# Patient Record
Sex: Male | Born: 1950 | Race: White | Hispanic: No | Marital: Single | Smoking: Former smoker
Health system: Southern US, Community
[De-identification: ages and names within clinical notes are randomized; demographics above are authoritative.]

## PROBLEM LIST (undated history)

## (undated) ENCOUNTER — Emergency Department (HOSPITAL_COMMUNITY): Discharge status: Absent without leave | Payer: Medicare HMO

## (undated) DIAGNOSIS — I2510 Atherosclerotic heart disease of native coronary artery without angina pectoris: Secondary | ICD-10-CM

## (undated) DIAGNOSIS — H547 Unspecified visual loss: Secondary | ICD-10-CM

## (undated) DIAGNOSIS — J449 Chronic obstructive pulmonary disease, unspecified: Secondary | ICD-10-CM

## (undated) DIAGNOSIS — M549 Dorsalgia, unspecified: Secondary | ICD-10-CM

## (undated) DIAGNOSIS — E785 Hyperlipidemia, unspecified: Secondary | ICD-10-CM

## (undated) DIAGNOSIS — I10 Essential (primary) hypertension: Secondary | ICD-10-CM

## (undated) DIAGNOSIS — W3400XA Accidental discharge from unspecified firearms or gun, initial encounter: Secondary | ICD-10-CM

## (undated) DIAGNOSIS — I219 Acute myocardial infarction, unspecified: Secondary | ICD-10-CM

## (undated) DIAGNOSIS — K279 Peptic ulcer, site unspecified, unspecified as acute or chronic, without hemorrhage or perforation: Secondary | ICD-10-CM

## (undated) DIAGNOSIS — E7800 Pure hypercholesterolemia, unspecified: Secondary | ICD-10-CM

## (undated) DIAGNOSIS — I739 Peripheral vascular disease, unspecified: Secondary | ICD-10-CM

## (undated) DIAGNOSIS — I809 Phlebitis and thrombophlebitis of unspecified site: Secondary | ICD-10-CM

## (undated) DIAGNOSIS — F419 Anxiety disorder, unspecified: Secondary | ICD-10-CM

## (undated) DIAGNOSIS — M1990 Unspecified osteoarthritis, unspecified site: Secondary | ICD-10-CM

## (undated) DIAGNOSIS — IMO0001 Reserved for inherently not codable concepts without codable children: Secondary | ICD-10-CM

## (undated) DIAGNOSIS — R5383 Other fatigue: Secondary | ICD-10-CM

## (undated) DIAGNOSIS — A084 Viral intestinal infection, unspecified: Secondary | ICD-10-CM

## (undated) DIAGNOSIS — M25559 Pain in unspecified hip: Secondary | ICD-10-CM

## (undated) DIAGNOSIS — R5381 Other malaise: Secondary | ICD-10-CM

## (undated) DIAGNOSIS — K219 Gastro-esophageal reflux disease without esophagitis: Secondary | ICD-10-CM

## (undated) HISTORY — PX: OTHER SURGICAL HISTORY: SHX169

## (undated) HISTORY — PX: CORONARY ARTERY BYPASS GRAFT: SHX141

## (undated) HISTORY — DX: Viral intestinal infection, unspecified: A08.4

## (undated) HISTORY — DX: Dorsalgia, unspecified: M54.9

## (undated) HISTORY — DX: Peptic ulcer, site unspecified, unspecified as acute or chronic, without hemorrhage or perforation: K27.9

## (undated) HISTORY — DX: Reserved for inherently not codable concepts without codable children: IMO0001

## (undated) HISTORY — DX: Other malaise: R53.81

## (undated) HISTORY — DX: Pain in unspecified hip: M25.559

## (undated) HISTORY — DX: Pure hypercholesterolemia, unspecified: E78.00

## (undated) HISTORY — DX: Peripheral vascular disease, unspecified: I73.9

## (undated) HISTORY — PX: TONSILLECTOMY: SUR1361

## (undated) HISTORY — PX: CORONARY ANGIOPLASTY: SHX604

## (undated) HISTORY — DX: Acute myocardial infarction, unspecified: I21.9

## (undated) HISTORY — PX: BACK SURGERY: SHX140

## (undated) HISTORY — DX: Hyperlipidemia, unspecified: E78.5

## (undated) HISTORY — DX: Anxiety disorder, unspecified: F41.9

## (undated) HISTORY — DX: Unspecified visual loss: H54.7

## (undated) HISTORY — DX: Phlebitis and thrombophlebitis of unspecified site: I80.9

## (undated) HISTORY — DX: Other fatigue: R53.83

## (undated) HISTORY — DX: Essential (primary) hypertension: I10

## (undated) HISTORY — DX: Atherosclerotic heart disease of native coronary artery without angina pectoris: I25.10

---

## 2005-08-26 ENCOUNTER — Emergency Department (HOSPITAL_COMMUNITY): Admission: EM | Admit: 2005-08-26 | Discharge: 2005-08-26 | Payer: Self-pay | Admitting: Emergency Medicine

## 2006-05-20 ENCOUNTER — Inpatient Hospital Stay (HOSPITAL_COMMUNITY): Admission: EM | Admit: 2006-05-20 | Discharge: 2006-05-28 | Payer: Self-pay | Admitting: Emergency Medicine

## 2006-05-21 ENCOUNTER — Encounter: Payer: Self-pay | Admitting: Cardiology

## 2006-05-21 ENCOUNTER — Ambulatory Visit: Payer: Self-pay | Admitting: Cardiology

## 2006-05-22 ENCOUNTER — Ambulatory Visit: Payer: Self-pay | Admitting: Cardiothoracic Surgery

## 2006-06-13 ENCOUNTER — Ambulatory Visit: Payer: Self-pay | Admitting: Cardiothoracic Surgery

## 2006-06-19 ENCOUNTER — Ambulatory Visit (HOSPITAL_COMMUNITY): Admission: RE | Admit: 2006-06-19 | Discharge: 2006-06-19 | Payer: Self-pay | Admitting: Cardiovascular Disease

## 2006-06-21 ENCOUNTER — Ambulatory Visit: Payer: Self-pay | Admitting: Cardiovascular Disease

## 2006-07-01 ENCOUNTER — Ambulatory Visit: Payer: Self-pay | Admitting: Cardiothoracic Surgery

## 2006-07-09 ENCOUNTER — Encounter (HOSPITAL_COMMUNITY): Admission: RE | Admit: 2006-07-09 | Discharge: 2006-08-08 | Payer: Self-pay | Admitting: Cardiovascular Disease

## 2006-07-12 ENCOUNTER — Ambulatory Visit: Payer: Self-pay | Admitting: Cardiothoracic Surgery

## 2006-08-28 ENCOUNTER — Ambulatory Visit: Payer: Self-pay | Admitting: Cardiovascular Disease

## 2006-11-14 ENCOUNTER — Ambulatory Visit: Payer: Self-pay | Admitting: Cardiovascular Disease

## 2007-04-19 ENCOUNTER — Ambulatory Visit: Payer: Self-pay | Admitting: Cardiology

## 2007-04-20 ENCOUNTER — Inpatient Hospital Stay (HOSPITAL_COMMUNITY): Admission: EM | Admit: 2007-04-20 | Discharge: 2007-04-21 | Payer: Self-pay | Admitting: Emergency Medicine

## 2007-05-06 ENCOUNTER — Encounter (HOSPITAL_COMMUNITY): Admission: RE | Admit: 2007-05-06 | Discharge: 2007-06-05 | Payer: Self-pay | Admitting: Family Medicine

## 2007-05-12 ENCOUNTER — Ambulatory Visit: Payer: Self-pay | Admitting: Cardiovascular Disease

## 2007-05-13 ENCOUNTER — Ambulatory Visit: Payer: Self-pay | Admitting: Cardiovascular Disease

## 2007-05-23 ENCOUNTER — Ambulatory Visit: Payer: Self-pay

## 2007-08-11 ENCOUNTER — Ambulatory Visit: Payer: Self-pay | Admitting: Cardiovascular Disease

## 2007-09-03 ENCOUNTER — Ambulatory Visit (HOSPITAL_COMMUNITY): Admission: RE | Admit: 2007-09-03 | Discharge: 2007-09-03 | Payer: Self-pay | Admitting: Cardiovascular Disease

## 2007-09-03 ENCOUNTER — Ambulatory Visit: Payer: Self-pay | Admitting: Cardiovascular Disease

## 2007-09-22 ENCOUNTER — Ambulatory Visit: Payer: Self-pay | Admitting: Cardiovascular Disease

## 2007-10-08 ENCOUNTER — Ambulatory Visit: Payer: Self-pay

## 2007-12-22 ENCOUNTER — Ambulatory Visit: Payer: Self-pay | Admitting: Cardiovascular Disease

## 2007-12-22 LAB — CONVERTED CEMR LAB
ALT: 30 units/L (ref 0–53)
AST: 19 units/L (ref 0–37)
Bilirubin, Direct: 0.1 mg/dL (ref 0.0–0.3)
Total Bilirubin: 0.8 mg/dL (ref 0.3–1.2)

## 2008-04-08 ENCOUNTER — Ambulatory Visit: Payer: Self-pay

## 2008-05-14 ENCOUNTER — Ambulatory Visit: Payer: Self-pay

## 2008-05-22 DIAGNOSIS — I739 Peripheral vascular disease, unspecified: Secondary | ICD-10-CM | POA: Insufficient documentation

## 2008-05-22 DIAGNOSIS — I809 Phlebitis and thrombophlebitis of unspecified site: Secondary | ICD-10-CM | POA: Insufficient documentation

## 2008-05-22 DIAGNOSIS — R5381 Other malaise: Secondary | ICD-10-CM

## 2008-05-22 DIAGNOSIS — M25559 Pain in unspecified hip: Secondary | ICD-10-CM

## 2008-05-22 DIAGNOSIS — E785 Hyperlipidemia, unspecified: Secondary | ICD-10-CM

## 2008-05-22 DIAGNOSIS — F411 Generalized anxiety disorder: Secondary | ICD-10-CM | POA: Insufficient documentation

## 2008-05-22 DIAGNOSIS — M549 Dorsalgia, unspecified: Secondary | ICD-10-CM | POA: Insufficient documentation

## 2008-05-22 DIAGNOSIS — E7800 Pure hypercholesterolemia, unspecified: Secondary | ICD-10-CM

## 2008-05-22 DIAGNOSIS — I219 Acute myocardial infarction, unspecified: Secondary | ICD-10-CM | POA: Insufficient documentation

## 2008-05-22 DIAGNOSIS — I2510 Atherosclerotic heart disease of native coronary artery without angina pectoris: Secondary | ICD-10-CM | POA: Insufficient documentation

## 2008-05-22 DIAGNOSIS — K921 Melena: Secondary | ICD-10-CM

## 2008-05-22 DIAGNOSIS — R5383 Other fatigue: Secondary | ICD-10-CM

## 2008-05-24 ENCOUNTER — Ambulatory Visit (HOSPITAL_COMMUNITY): Admission: RE | Admit: 2008-05-24 | Discharge: 2008-05-24 | Payer: Self-pay | Admitting: Family Medicine

## 2008-05-27 ENCOUNTER — Encounter: Payer: Self-pay | Admitting: Cardiovascular Disease

## 2008-06-18 ENCOUNTER — Encounter: Payer: Self-pay | Admitting: Cardiovascular Disease

## 2008-06-23 ENCOUNTER — Ambulatory Visit: Payer: Self-pay | Admitting: Cardiovascular Disease

## 2008-06-23 DIAGNOSIS — F17200 Nicotine dependence, unspecified, uncomplicated: Secondary | ICD-10-CM

## 2008-06-25 ENCOUNTER — Telehealth: Payer: Self-pay | Admitting: Cardiovascular Disease

## 2008-07-02 ENCOUNTER — Telehealth: Payer: Self-pay | Admitting: Cardiovascular Disease

## 2008-08-26 ENCOUNTER — Encounter (INDEPENDENT_AMBULATORY_CARE_PROVIDER_SITE_OTHER): Payer: Self-pay | Admitting: *Deleted

## 2008-10-13 ENCOUNTER — Encounter
Admission: RE | Admit: 2008-10-13 | Discharge: 2008-10-13 | Payer: Self-pay | Admitting: Physical Medicine and Rehabilitation

## 2008-10-15 ENCOUNTER — Ambulatory Visit: Payer: Self-pay

## 2008-10-15 ENCOUNTER — Encounter: Payer: Self-pay | Admitting: Cardiovascular Disease

## 2008-11-03 ENCOUNTER — Encounter (INDEPENDENT_AMBULATORY_CARE_PROVIDER_SITE_OTHER): Payer: Self-pay | Admitting: *Deleted

## 2008-11-15 ENCOUNTER — Telehealth: Payer: Self-pay | Admitting: Cardiovascular Disease

## 2008-12-06 ENCOUNTER — Telehealth (INDEPENDENT_AMBULATORY_CARE_PROVIDER_SITE_OTHER): Payer: Self-pay | Admitting: *Deleted

## 2008-12-08 ENCOUNTER — Inpatient Hospital Stay (HOSPITAL_COMMUNITY): Admission: RE | Admit: 2008-12-08 | Discharge: 2008-12-10 | Payer: Self-pay | Admitting: Orthopedic Surgery

## 2008-12-15 ENCOUNTER — Emergency Department (HOSPITAL_COMMUNITY): Admission: EM | Admit: 2008-12-15 | Discharge: 2008-12-16 | Payer: Self-pay | Admitting: Emergency Medicine

## 2009-02-02 ENCOUNTER — Encounter (HOSPITAL_COMMUNITY): Admission: RE | Admit: 2009-02-02 | Discharge: 2009-03-04 | Payer: Self-pay | Admitting: Orthopedic Surgery

## 2009-02-11 ENCOUNTER — Ambulatory Visit (HOSPITAL_COMMUNITY): Admission: RE | Admit: 2009-02-11 | Discharge: 2009-02-11 | Payer: Self-pay | Admitting: Family Medicine

## 2009-03-08 ENCOUNTER — Ambulatory Visit: Payer: Self-pay | Admitting: Cardiovascular Disease

## 2009-05-17 ENCOUNTER — Encounter: Payer: Self-pay | Admitting: Cardiovascular Disease

## 2009-05-17 DIAGNOSIS — R0989 Other specified symptoms and signs involving the circulatory and respiratory systems: Secondary | ICD-10-CM | POA: Insufficient documentation

## 2009-05-18 ENCOUNTER — Telehealth (INDEPENDENT_AMBULATORY_CARE_PROVIDER_SITE_OTHER): Payer: Self-pay | Admitting: *Deleted

## 2009-06-01 ENCOUNTER — Encounter: Payer: Self-pay | Admitting: Cardiovascular Disease

## 2009-06-01 ENCOUNTER — Ambulatory Visit: Payer: Self-pay

## 2009-10-18 ENCOUNTER — Encounter: Payer: Self-pay | Admitting: Cardiovascular Disease

## 2009-10-21 ENCOUNTER — Ambulatory Visit: Payer: Self-pay

## 2009-10-21 ENCOUNTER — Encounter: Payer: Self-pay | Admitting: Cardiovascular Disease

## 2009-11-21 ENCOUNTER — Telehealth: Payer: Self-pay | Admitting: Cardiovascular Disease

## 2009-12-23 ENCOUNTER — Encounter: Admission: RE | Admit: 2009-12-23 | Discharge: 2009-12-23 | Payer: Self-pay | Admitting: Orthopedic Surgery

## 2010-01-10 ENCOUNTER — Ambulatory Visit: Payer: Self-pay | Admitting: Cardiovascular Disease

## 2010-02-20 ENCOUNTER — Encounter: Payer: Self-pay | Admitting: Physical Medicine and Rehabilitation

## 2010-02-28 NOTE — Progress Notes (Signed)
Summary: pt having abnormal bleeding  Phone Note Call from Patient Call back at Home Phone [phone]   Caller: Spouse/Linda Reason for Call: Talk to Nurse, Talk to Doctor Summary of Call: pt hit his leg and his hand and it was bleeding alot and then it just clot right up and wife is concerned and wants pt to be seen  Initial call taken by: Omer Jack,  November 21, 2009 4:21 PM  Follow-up for Phone Call        pt's wife concerned b/c her husband bumped his hand and it bled more than usual. I advised her that this is normal when you are on a blood thinner. She is also concerned b/c his  hand is bruised which I also told her was normal just to keep it elevated & put some ice on it. She will call us back in the next few days if the bruising gets significantly worse. Whitney Maeola Sarah RN  November 21, 2009 4:49 PM  Follow-up by: Whitney Maeola Sarah RN,  November 21, 2009 4:44 PM

## 2010-02-28 NOTE — Miscellaneous (Signed)
Summary: Orders Update  Clinical Lists Changes  Problems: Added new problem of CAROTID ARTERY DISEASE (ICD-433.10) Orders: Added new Test order of Carotid Duplex (Carotid Duplex) - Signed 

## 2010-02-28 NOTE — Assessment & Plan Note (Signed)
Summary: ROV/ GD   CC:  check up.  History of Present Illness: [name] is seen today for F/U of smoking, PVD with previous right ilac stent.  CAD with prevous CABG and known occluded SVG to RCA.  He is not having any SSCP or claudication.  He is smoking about a ppd.  I counseled him for less than 10 minutes regarding cessation.  He said Chantix and Welbutrin made him worse.  We discussed as previously the use of nicotine replacement including gum and patches but he seems poorly motivated.  He has a significant anxiety disorder and has had issues with Xanax.  He lost his primary care doctor and has floated around now seeing Dr Janna Arch.  He tried to substitue ctalopram for xanax and Tyshan doesnt' like it.  Since I last saw him he had back surgery with Dr Shon Baton and wants to continue getting percocet or narcotic pills.  I told him frankly that both classses of meds are addictive and controlled.  I would like to refer him to behavioral health since he has not seen a psychiatrist before.    Current Problems (verified): 1)  Smoker  (ICD-305.1) 2)  Pre-operative Clearance  () 3)  Myocardial Infarction  (ICD-410.90) 4)  Pvd  (ICD-443.9) 5)  Cad  (ICD-414.00) 6)  Hyperlipidemia  (ICD-272.4) 7)  Peptic Ulcer Disease  (ICD-533.90) 8)  Anxiety  (ICD-300.00) 9)  Phlebitis  (ICD-451.9) 10)  Malaise and Fatigue  (ICD-780.79) 11)  Hypercholesterolemia  (ICD-272.0) 12)  Back Pain  (ICD-724.5) 13)  Hip Pain  (ICD-719.45)  Current Medications (verified): 1)  Folic Acid 1 Mg Tabs (Folic Acid) .... Take 1 Tablet By Mouth Once A Day 2)  Metoprolol Tartrate 25 Mg Tabs (Metoprolol Tartrate) .... Take 1 Tablet By Mouth Two Times A Day 3)  Zocor 40 Mg Tabs (Simvastatin) .... Take 1 Tablet By Mouth Once A Day 4)  Aspirin 81 Mg Tbec (Aspirin) .... Take One Tablet By Mouth Daily 5)  Plavix 75 Mg Tabs (Clopidogrel Bisulfate) .... Take One Tablet By Mouth Daily 6)  Omega-3 Fish Oil 1000 Mg Caps (Omega-3 Fatty  Acids) .... Take 1 Capsule By Mouth Three Times A Day 7)  Nitroglycerin 0.4 Mg Subl (Nitroglycerin) .... One Tablet Under Tongue Every 5 Minutes As Needed For Chest Pain---May Repeat Times Three 8)  Oxycodone-Acetaminophen 7.5-325 Mg Tabs (Oxycodone-Acetaminophen) .... Take 1 Tablet By Mouth Three Times A Day 9)  Trazodone Hcl 50 Mg Tabs (Trazodone Hcl) .... As Needed 10)  Celexa 20 Mg Tabs (Citalopram Hydrobromide) .Marland Kitchen.. 1 Tyab By Mouth Once Daily  Allergies (verified): No Known Drug Allergies  Past History:  Past Medical History: Last updated: 05/22/2008 Current Problems:  MYOCARDIAL INFARCTION (ICD-410.90) PVD (ICD-443.9) CAD (ICD-414.00) HYPERLIPIDEMIA (ICD-272.4) PEPTIC ULCER DISEASE (ICD-533.90) ANXIETY (ICD-300.00) PHLEBITIS (ICD-451.9) MALAISE AND FATIGUE (ICD-780.79) HYPERCHOLESTEROLEMIA (ICD-272.0) BACK PAIN (ICD-724.5) HIP PAIN (ICD-719.45)  Previous hypertension, currently well controlled. Transient vision loss. Recent viral gastroenteritis ruptured plaque in his left main.  Past Surgical History: Last updated: 05/22/2008 CABG: Tyrone Sage 04/2006 Emergency coronary artery bypass grafting x4 with  the left internal mammary sequentially to the second diagonal and distal  left anterior descending artery, reverse saphenous vein graft to the  first obtuse marginal, reverse saphenous vein graft to the posterior  descending with right thigh and calf endovein harvesting. Cath:  04/21/2007 RCA graft occluded non-flow limiting disease in native RCA  Medical Therapy  PVD:  Excell Seltzer 09/03/2007  Abdominal aortography with runoff, right common iliac  angiography, and right external iliac stenting  colectomy brain operations   Family History: Last updated: 05/22/2008 Is pertinent for congestive heart failure in his mother   who died at age 18.  Father died from cancer.  He has a sister who had a   myocardial infarction in her 77s.      Social History: Last updated:  05/22/2008 The patient lives in [place] with his girlfriend.   He is on disability.  He has a longstanding tobacco history.  He has a   history of heavy alcohol use but has cut back over the last several   weeks and is only drinking occasional beer at present.  No recent   history of drug use.   Review of Systems       Denies fever, malais, weight loss, blurry vision, decreased visual acuity, cough, sputum, SOB, hemoptysis, pleuritic pain, palpitaitons, heartburn, abdominal pain, melena, lower extremity edema, claudication, or rash. all other systems reviewed and negative  Vital Signs:  Patient profile:   60 year old male Height:      68 inches Weight:      162 pounds BMI:     24.72 Pulse rate:   62 / minute Resp:     14 per minute BP sitting:   135 / 79  (left arm)  Vitals Entered By: Kem Parkinson (March 08, 2009 10:31 AM)  Physical Exam  General:  Affect appropriate Healthy:  appears stated age HEENT: normal Neck supple with no adenopathy JVP normal no bruits no thyromegaly Lungs clear with no wheezing and good diaphragmatic motion Heart:  S1/S2 no murmur,rub, gallop or click PMI normal Abdomen: benighn, BS positve, no tenderness, no AAA bilateal  bruit.  No HSM or HJR Distal pulses intact with no bruits No edema Neuro non-focal Skin warm and dry    Impression & Recommendations:  Problem # 1:  SMOKER (ICD-305.1) Nicorette gum or patches.  Failed chantix and welbutrin in past.  Vasuclar risk of continued smoking discussed at length  Problem # 2:  PVD (ICD-443.9) No claudication. Prev. iliac stent.  F/U ABI's in 6 months  Problem # 3:  CAD (ICD-414.00) CABG with occluded SVG to RCA.  Normal LV  Continue medical Rx The following medications were removed from the medication list:    Lisinopril 5 Mg Tabs (Lisinopril) .Marland Kitchen... Take 1 tablet by mouth once a day    Metoprolol Tartrate 25 Mg Tabs (Metoprolol tartrate) .Marland Kitchen... 1 tablet by mouth twice a day     Nitroglycerin 0.4 Mg Subl (Nitroglycerin) .Marland Kitchen... Place 1 tablet under tongue as directed    Plavix 75 Mg Tabs (Clopidogrel bisulfate) .Marland Kitchen... Take 1 tablet daily His updated medication list for this problem includes:    Metoprolol Tartrate 25 Mg Tabs (Metoprolol tartrate) .Marland Kitchen... Take 1 tablet by mouth two times a day    Aspirin 81 Mg Tbec (Aspirin) .Marland Kitchen... Take one tablet by mouth daily    Plavix 75 Mg Tabs (Clopidogrel bisulfate) .Marland Kitchen... Take one tablet by mouth daily    Nitroglycerin 0.4 Mg Subl (Nitroglycerin) ..... One tablet under tongue every 5 minutes as needed for chest pain---may repeat times three  Problem # 4:  HYPERLIPIDEMIA (ICD-272.4) Continue statin  Lab work per [employer] The following medications were removed from the medication list:    Simvastatin 40 Mg Tabs (Simvastatin) .Marland Kitchen... Take 1 tablet by mouth at bedtime His updated medication list for this problem includes:    Zocor 40 Mg Tabs (Simvastatin) .Marland Kitchen... Take  1 tablet by mouth once a day  Problem # 5:  ANXIETY (ICD-300.00) Refer to behavioral health.  No xanax or percocet.  Mahy also benefit from pain mainagement referral Orders: Misc. Referral (Misc. Ref)  Patient Instructions: 1)  Your physician recommends that you schedule a follow-up appointment in: 6 MONTHS 2)  You have been referred to BEHAVIORAL HEALTH   EKG Report  Procedure date:  06/23/2008  Findings:      Sinus brady 59 T wave inversion 3,f Abnormal ECG

## 2010-02-28 NOTE — Miscellaneous (Signed)
Summary: Orders Update  Clinical Lists Changes  Orders: Added new Test order of Arterial Duplex Lower Extremity (Arterial Duplex Low) - Signed 

## 2010-02-28 NOTE — Assessment & Plan Note (Signed)
Summary: chest discomfort  Nurse Visit   Vital Signs:  Patient profile:   60 year old male Pulse rate:   47 / minute BP sitting:   138 / 70  (right arm) Cuff size:   large  Vitals Entered By: Sherri Rad, RN, BSN (Jun 01, 2009 2:44 PM)  Current Medications (verified): 1)  Folic Acid 1 Mg Tabs (Folic Acid) .... Take 1 Tablet By Mouth Once A Day 2)  Metoprolol Tartrate 25 Mg Tabs (Metoprolol Tartrate) .... Take 1 Tablet By Mouth Two Times A Day 3)  Zocor 40 Mg Tabs (Simvastatin) .... Take 1 Tablet By Mouth Once A Day 4)  Aspirin 81 Mg Tbec (Aspirin) .... Take One Tablet By Mouth Daily 5)  Plavix 75 Mg Tabs (Clopidogrel Bisulfate) .... Take One Tablet By Mouth Daily 6)  Omega-3 Fish Oil 1000 Mg Caps (Omega-3 Fatty Acids) .... Take 1 Capsule By Mouth Three Times A Day 7)  Nitroglycerin 0.4 Mg Subl (Nitroglycerin) .... One Tablet Under Tongue Every 5 Minutes As Needed For Chest Pain---May Repeat Times Three 8)  Oxycodone-Acetaminophen 7.5-325 Mg Tabs (Oxycodone-Acetaminophen) .... Take 1 Tablet By Mouth Three Times A Day 9)  Trazodone Hcl 50 Mg Tabs (Trazodone Hcl) .... As Needed  Allergies (verified): No Known Drug Allergies  Visit Type:  Nurse Visit Primary Provider:  Dr. Delbert Harness  CC:  chest discomfort.  History of Present Illness: The pt was in the office today for a followup carotid ultrasound. Nursing was asked to see him at the request of our Vascular Tech. The pt states he has complaints of chest discomfort with some discomfort to the neck and jaw area for about a month. He did stop smoking "cold [place]" about a month ago. The pt states he has episodes about every day, but they are improved since he quit smoking. He thinks his discomfort is related to the fact that he may be stressed about having stopped smoking. The pt did mention he is not pleased by the fact that Dr. Cecelia Byars stopped his Xanax. He states he may be trying to find a new doctor due to this fact. I asked him if  he ever followed up with Behavioral Health. He states he did not. I have reviewed the above with Dr. Jens Som. He has had  no EKG changes. The pt did not want to f/u with Dr. Jens Som today or Dr. [name] tomorrow. He feels his symptoms are r/t the fact he has quit smoking. He has not required any NTG. I have instructed the pt should his symptoms become more persistent, that he should let us know or report to the ER. The pt verbalizes understanding.

## 2010-02-28 NOTE — Progress Notes (Signed)
----   Converted from flag ---- ---- 04/29/2009 2:45 PM, Merita Norton Lloyd-Fate wrote: Pt has not returned any of jennifer calls from Dr. Dawayne Cirri office. Merita Norton Lloyd-Fate  April 29, 2009 2:45 PM'  ---- 03/08/2009 10:56 AM, Connye Burkitt wrote:   ---- 03/08/2009 10:51 AM, Deliah Goody, RN wrote: The following orders have been entered for this patient and placed on Admin Hold:  Type:     Referral       Code:   Misc. Ref Description:   Misc. Referral Order Date:   03/08/2009   Authorized By:   Colon Branch, MD, [facility] Order #:   [id number] Clinical Notes:   Type of Referral:BEHAVIORAL HEALTH Reason:ANXIETY ------------------------------

## 2010-03-02 NOTE — Assessment & Plan Note (Signed)
Summary: rov   Visit Type:  Follow-up Primary Provider:  Dr. Delbert Harness  CC:  10 month ROV; C/O shortness of Breath w/o exertion; Change Zocor (?).  History of Present Illness: [name] is seen today for F/U of smoking, PVD with previous right ilac stent.  CAD with prevous CABG and known occluded SVG to RCA.  He is not having any SSCP or claudication.  He is smoking about a ppd.  I counseled him for less than 10 minutes regarding cessation.  He said Chantix and Welbutrin made him worse.  We discussed as previously the use of nicotine replacement including gum and patches but he seems poorly motivated.  He has a significant anxiety disorder and has had issues with Xanax.  He lost his primary care doctor and has floated around last  seeing Dr Janna Arch.  He tried to substitue ctalopram for xanax and Roey doesnt' like it.  He has some anger issues and actually admits to buying Xanax on the street.  We discussed a referral to behavioral health and I think this would be helpful  Since I last saw him he had back surgery with Dr Shon Baton with limited success  He still has a lot of back pain and is due to get an injection next week.  Told him to stop ASA 5 days before  Reviewed ABI;s  10/21/09 ok Reviewed carotids 5/111  40-59% bilateral disease  Current Problems (verified): 1)  Carotid Artery Disease  (ICD-433.10) 2)  Smoker  (ICD-305.1) 3)  Pre-operative Clearance  () 4)  Myocardial Infarction  (ICD-410.90) 5)  Pvd  (ICD-443.9) 6)  Cad  (ICD-414.00) 7)  Hyperlipidemia  (ICD-272.4) 8)  Peptic Ulcer Disease  (ICD-533.90) 9)  Anxiety  (ICD-300.00) 10)  Phlebitis  (ICD-451.9) 11)  Malaise and Fatigue  (ICD-780.79) 12)  Hypercholesterolemia  (ICD-272.0) 13)  Back Pain  (ICD-724.5) 14)  Hip Pain  (ICD-719.45)  Current Medications (verified): 1)  Folic Acid 1 Mg Tabs (Folic Acid) .... Take 1 Tablet By Mouth Once A Day 2)  Metoprolol Tartrate 25 Mg Tabs (Metoprolol Tartrate) .... Take 1 Tablet By  Mouth Two Times A Day 3)  Zocor 40 Mg Tabs (Simvastatin) .... Take 1 Tablet By Mouth Once A Day 4)  Aspirin 81 Mg Tbec (Aspirin) .... Take One Tablet By Mouth Daily 5)  Plavix 75 Mg Tabs (Clopidogrel Bisulfate) .... Take One Tablet By Mouth Daily 6)  Omega-3 Fish Oil 1000 Mg Caps (Omega-3 Fatty Acids) .... Take 1 Capsule By Mouth Three Times A Day 7)  Nitroglycerin 0.4 Mg Subl (Nitroglycerin) .... One Tablet Under Tongue Every 5 Minutes As Needed For Chest Pain---May Repeat Times Three 8)  Hydrocodone-Acetaminophen 7.5-500 Mg Tabs (Hydrocodone-Acetaminophen) .... As Directed 9)  Trazodone Hcl 50 Mg Tabs (Trazodone Hcl) .... As Needed  Allergies (verified): No Known Drug Allergies  Past History:  Past Medical History: Last updated: 05/22/2008 Current Problems:  MYOCARDIAL INFARCTION (ICD-410.90) PVD (ICD-443.9) CAD (ICD-414.00) HYPERLIPIDEMIA (ICD-272.4) PEPTIC ULCER DISEASE (ICD-533.90) ANXIETY (ICD-300.00) PHLEBITIS (ICD-451.9) MALAISE AND FATIGUE (ICD-780.79) HYPERCHOLESTEROLEMIA (ICD-272.0) BACK PAIN (ICD-724.5) HIP PAIN (ICD-719.45)  Previous hypertension, currently well controlled. Transient vision loss. Recent viral gastroenteritis ruptured plaque in his left main.  Past Surgical History: Last updated: 05/22/2008 CABG: Tyrone Sage 04/2006 Emergency coronary artery bypass grafting x4 with  the left internal mammary sequentially to the second diagonal and distal  left anterior descending artery, reverse saphenous vein graft to the  first obtuse marginal, reverse saphenous vein graft to the posterior  descending with  right thigh and calf endovein harvesting. Cath:  04/21/2007 RCA graft occluded non-flow limiting disease in native RCA  Medical Therapy  PVD:  Excell Seltzer 09/03/2007  Abdominal aortography with runoff, right common iliac   angiography, and right external iliac stenting  colectomy brain operations   Family History: Last updated: 05/22/2008 Is pertinent for  congestive heart failure in his mother   who died at age 43.  Father died from cancer.  He has a sister who had a   myocardial infarction in her 68s.      Social History: Last updated: 05/22/2008 The patient lives in [place] with his girlfriend.   He is on disability.  He has a longstanding tobacco history.  He has a   history of heavy alcohol use but has cut back over the last several   weeks and is only drinking occasional beer at present.  No recent   history of drug use.   Review of Systems       Denies fever, malais, weight loss, blurry vision, decreased visual acuity, cough, sputum, SOB, hemoptysis, pleuritic pain, palpitaitons, heartburn, abdominal pain, melena, lower extremity edema, claudication, or rash.   Vital Signs:  Patient profile:   60 year old male Height:      68 inches Weight:      172 pounds BMI:     26.25 Pulse rate:   60 / minute Pulse rhythm:   regular BP sitting:   136 / 80  (left arm) Cuff size:   regular  Vitals Entered By: Stanton Kidney, EMT-P (January 10, 2010 10:41 AM)  Physical Exam  General:  Affect appropriate Healthy:  appears stated age HEENT: normal Neck supple with no adenopathy JVP normal left bruits no thyromegaly Lungs clear with no wheezing and good diaphragmatic motion Heart:  S1/S2 no murmur,rub, gallop or click PMI normal Abdomen: benighn, BS positve, no tenderness, no AAA no bruit.  No HSM or HJR Distal pulses intact with bilateral femoral bruits No edema Neuro non-focal Skin warm and dry    Impression & Recommendations:  Problem # 1:  CAROTID ARTERY DISEASE (ICD-433.10) Continue ASA and Plavix  F/U duplex 5/12  40-59% bilateral disease His updated medication list for this problem includes:    Aspirin 81 Mg Tbec (Aspirin) .Marland Kitchen... Take one tablet by mouth daily    Plavix 75 Mg Tabs (Clopidogrel bisulfate) .Marland Kitchen... Take one tablet by mouth daily  Orders: Carotid Duplex (Carotid Duplex)  Problem # 2:  SMOKER  (ICD-305.1) Failed Chantix and Welbutrin  Not modivated to use patches.  Discussed risk of lung cancer and progressive vascular disease  Problem # 3:  PVD (ICD-443.9) Stable ABI's with activity limited by back pain not claudication  Problem # 4:  CAD (ICD-414.00) Stable no angina His updated medication list for this problem includes:    Metoprolol Tartrate 25 Mg Tabs (Metoprolol tartrate) .Marland Kitchen... Take 1 tablet by mouth two times a day    Aspirin 81 Mg Tbec (Aspirin) .Marland Kitchen... Take one tablet by mouth daily    Plavix 75 Mg Tabs (Clopidogrel bisulfate) .Marland Kitchen... Take one tablet by mouth daily    Nitroglycerin 0.4 Mg Subl (Nitroglycerin) ..... One tablet under tongue every 5 minutes as needed for chest pain---may repeat times three  Problem # 5:  HYPERLIPIDEMIA (ICD-272.4) Continue statin Check LFTS next visit as he is not seeing a primary His updated medication list for this problem includes:    Zocor 40 Mg Tabs (Simvastatin) .Marland Kitchen... 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  Will refer to behavioral health Orders: Misc. Referral (Misc. Ref)  Problem # 7:  BACK PAIN (ICD-724.5) F/U Dr Shon Baton.  Unfortunately he has resultant chronic pain that has led to dependance.  Injection latter next week  Hold ASA and Plavix 5 days before  Patient Instructions: 1)  Your physician recommends that you schedule a follow-up appointment in: May 2012 with a carotid doppler the same day 2)  Your physician recommends that you continue on your current medications as directed. Please refer to the Current Medication list given to you today. 3)  Your physician has requested that you have a carotid duplex in May. This test is an ultrasound of the carotid arteries in your neck. It looks at blood flow through these arteries that supply the brain with blood. Allow one hour for this exam. There are no restrictions or special instructions. 4)   You have been referred to [facility]. Prescriptions: NITROGLYCERIN 0.4 MG SUBL (NITROGLYCERIN) One tablet under tongue every 5 minutes as needed for chest pain---may repeat times three  #21 x 6   Entered by:   Stanton Kidney, EMT-P   Authorized by:   Colon Branch, MD, [facility]   Signed by:   Stanton Kidney, EMT-P on 01/10/2010   Method used:   Electronically to        [facility]  [address] (retail)       [address]       [place], [place]  [address]       Ph: [phone]       Fax: [phone]   RxID:   [id number]

## 2010-05-03 ENCOUNTER — Telehealth: Payer: Self-pay | Admitting: Cardiovascular Disease

## 2010-05-03 LAB — CBC
HCT: 29.9 % — ABNORMAL LOW (ref 39.0–52.0)
Hemoglobin: 10.3 g/dL — ABNORMAL LOW (ref 13.0–17.0)
Hemoglobin: 14.1 g/dL (ref 13.0–17.0)
MCHC: 34.4 g/dL (ref 30.0–36.0)
MCV: 92.5 fL (ref 78.0–100.0)
MCV: 92.9 fL (ref 78.0–100.0)
MCV: 93.4 fL (ref 78.0–100.0)
RBC: 3.22 MIL/uL — ABNORMAL LOW (ref 4.22–5.81)
RBC: 3.49 MIL/uL — ABNORMAL LOW (ref 4.22–5.81)
RDW: 13.2 % (ref 11.5–15.5)
WBC: 16.1 10*3/uL — ABNORMAL HIGH (ref 4.0–10.5)
WBC: 16.3 10*3/uL — ABNORMAL HIGH (ref 4.0–10.5)

## 2010-05-03 LAB — POCT I-STAT 4, (NA,K, GLUC, HGB,HCT)
Glucose, Bld: 139 mg/dL — ABNORMAL HIGH (ref 70–99)
HCT: 35 % — ABNORMAL LOW (ref 39.0–52.0)
Hemoglobin: 11.9 g/dL — ABNORMAL LOW (ref 13.0–17.0)

## 2010-05-03 LAB — BASIC METABOLIC PANEL
CO2: 29 mEq/L (ref 19–32)
Calcium: 10.1 mg/dL (ref 8.4–10.5)
Glucose, Bld: 91 mg/dL (ref 70–99)
Sodium: 140 mEq/L (ref 135–145)

## 2010-05-03 LAB — TYPE AND SCREEN: Antibody Screen: NEGATIVE

## 2010-05-03 NOTE — Telephone Encounter (Addendum)
Spoke with pts wife, pt is being evaluated by dr ramus and dr Shon Baton for spinal problems. They are asking the pt to stop his plavix 5 days prior to procedure and 5 days after the procedure. Uncertain of procedure type, wife was unsure. Will forward for dr [name] review.

## 2010-05-03 NOTE — Telephone Encounter (Signed)
Pt needs to go off plavix for 10 days due to a procedure on his back

## 2010-05-03 NOTE — Telephone Encounter (Signed)
Unable to reach pt or leave a message  

## 2010-05-04 NOTE — Telephone Encounter (Signed)
Spoke with pt wife, she is aware he is okay to hold his plavix. [name]

## 2010-05-04 NOTE — Telephone Encounter (Signed)
Ok to stop plavix for spine implant

## 2010-06-01 ENCOUNTER — Other Ambulatory Visit: Payer: Self-pay | Admitting: *Deleted

## 2010-06-01 MED ORDER — SIMVASTATIN 40 MG PO TABS: 40.0000 mg | ORAL_TABLET | Freq: Every evening | ORAL | Status: DC

## 2010-06-13 NOTE — Discharge Summary (Signed)
NAMEDONTREZ, PETTIS NO.:  [id number]   MEDICAL RECORD NO.:  [id number]          PATIENT TYPE:  INP   LOCATION:  2041                         FACILITY:  [facility]   PHYSICIAN:  Sheliah Plane, MD    DATE OF BIRTH:  1950-02-22   DATE OF ADMISSION:  05/20/2006  DATE OF DISCHARGE:  05/28/2006                               DISCHARGE SUMMARY   ADDENDUM TO DISCHARGE SUMMARY:  [name] was originally scheduled to be discharged home on May 27, 2006.  However, upon morning round evaluation on the 28th, he was found  to have a decrease in his hemoglobin and hematocrit from a prior level  of 7.3 and 21.6, respectively to 6.6 and 19.4, respectively.  The  patient was completely asymptomatic and stool guaiacs were negative.  There were no overt signs of bleeding and he felt well otherwise.  He  had already been started on oral iron replacement and folic acid.  He  was seen and evaluated by Dr. Tyrone Sage and the patient requested to  avoid a transfusion.  It was elected to continue to monitor this  conservatively and repeat a CBC the following morning.  [name]  remained stable and his hemoglobin and hematocrit also remained stable.  On May 28, 2006, his CBC showed a hemoglobin of 6.5, hematocrit 19.1,  white count 7.6, platelets 440.  His blood pressure was stable at  155/91, and heart rate was in the 90s.  He was feeling well, was  ambulating without problem, and had otherwise continued to progress as  expected postoperatively.  After evaluating the patient, Dr. Tyrone Sage  felt that it was safe to discharge home with close outpatient followup  of his hemoglobin and hematocrit.   DISCHARGE MEDICATIONS:  Are as follows.  1. Enteric-coated aspirin 325 mg daily.  2. Lopressor 25 mg b.i.d.  3. Lisinopril 5 mg daily.  4. Zocor 40 mg q.h.s.  5. Ferrous sulfate 325 mg b.i.d.  6. Folic acid 1 mg daily.  7. Tylox one to two q.4 h p.r.n. for pain. (A number of his  medications were changed from the previously dictated discharge      summary as the patient had no insurance and elected to obtain his      medications from [facility], and the medications which were      changed were not on their formulary for $4 prescriptions.  His      medications were changed accordingly to suitable equivalents.)   [name] will have a CBC drawn in the next 48 hours at [facility] and these results will be called to Dr. Dennie Maizes office  for re-evaluation of his anemia.   The remainder of his discharge summary including discharge follow-up and  instructions is unchanged from the previous dictation.      [name], P.A.      Sheliah Plane, MD  Electronically Signed    GC/MEDQ  D:  07/04/2006  T:  07/04/2006  Job:  [id number]   cc:   Cody Abed, MD, [facility]  Cody Faith, MD

## 2010-06-13 NOTE — Progress Notes (Signed)
[facility]                        PERIPHERAL VASCULAR OFFICE NOTE   [name], [name]                      MRN:          [id number]  DATE:08/11/2007                            DOB:          July 20, 1950    HISTORY OF PRESENT ILLNESS:  [name] was seen in followup at the  [facility] Peripheral [facility]ascular Office on August 11, 2007.  He has a history  of coronary and peripheral arterial disease.  [name] has right leg  claudication and has moderate iliac stenosis on the right.  He has  buttock, hip, thigh, and calf pain, most prominently on the right.  He  describes this is a tightness.  This occurs with relatively low-level  activity, and he is almost completely unable to walk up a hill.  He has  mild symptoms on the left side.  He has had no rest pain.  He has no  ischemic ulcerations.  He was initially seen here in April and was  started on Pletal at that time.  He has had no improvement in symptoms  since starting Pletal.   In addition, [name] describes recent episodes of vision loss.  He has  had a total of three episodes, where he has lost vision in both eyes and  has had associated lightheadedness.  The last two episodes occurred when  he was outside on hot days.  He has not had syncopal episodes.  His  symptoms remain transient and resolve after he sits down and rests.   MEDICATIONS:  1. Folic acid 1 mg daily.  2. Lopressor 25 mg twice daily.  3. Aspirin 325 mg daily.  4. Xanax 0.5 mg t.i.d.  5. Zocor 40 mg daily.  6. Lisinopril 20 mg daily.  7. Pletal 100 mg twice daily.  8. Fish oil 1 g 3 times daily.   ALLERGIES:  ULTRACET.   PHYSICAL EXAMINATION:  GENERAL:  The patient is alert and oriented.  He  is in no acute distress.  VITAL SIGNS:  Weight is 159 pounds, blood pressure 110/70, heart rate  72, and respiratory rate 16.  HEENT:  Normal.  NECK:  Normal carotid upstrokes without bruits.  LUNGS:  Clear to auscultation  bilaterally.  JVP is normal.  CARDIAC:  Heart has regular rate and rhythm without murmurs or gallops.  ABDOMEN:  Soft and nontender.  No organomegaly.  BACK:  No CVA tenderness.  EXTREMITIES:  No clubbing, cyanosis, or edema.  Femoral pulses 2+ on the  left and 1+ on the right.  Posterior tibial pulses 2+ bilateral.  Dorsalis pedis pulses are not palpable.  SKIN:  Warm and dry without rash.  NEUROLOGIC:  Cranial nerves II through XII intact.  Strength is intact  and equal.   STUDIES:  Carotid ultrasound from May 23, 2007, showed mild internal  carotid artery stenosis on the right, moderate on the left in the range  of 40-59%.  Lower extremity duplex also from May 23, 2007 was showing  ABI of 0.75 on the right and 0.96 on the left.  Monophasic flow in the  right lower extremity suggested  significant inflow disease.   ASSESSMENT:  1. Lower extremity peripheral arterial disease.  He appears to have      inflow disease based on his lower extremity duplex scan with a      significantly decreased ankle-brachial index on the right.  Mr.      Nelson also has lifestyle-limiting symptoms.  I discussed proceeding      with angiography since he has not responded to Pletal.  He is eager      to proceed, and this will be scheduled to see if he is a candidate      for endovascular treatment of his lower extremity peripheral      arterial disease.  He is to continue on aspirin, Pletal and his      current medical therapy as outlined, see below for changes.  2. Transient vision loss.  This seems to be hemodynamically mediated      based on his symptoms.  The two most recent episodes have occurred      during hot days, and I suspect he is becoming hypotensive.  I have      asked him to hold his lisinopril and continue his Lopressor in the      setting of coronary artery disease.  Of note, he has normal left      ventricular function.  If he has recurrent episodes in spite of      these changes, he  will need a more extensive evaluation.  3. Coronary artery disease status post coronary artery bypass graft.      Continue followup with Dr. [name].     Veverly Fells. Excell Seltzer, MD  Electronically Signed    MDC/MedQ  DD: 08/11/2007  DT: 08/12/2007  Job #: [id number]   cc:   [name]. [name], MD, [facility]  Scott Long

## 2010-06-13 NOTE — Assessment & Plan Note (Signed)
[facility]                            CARDIOLOGY OFFICE NOTE   TOBY, BREITHAUPT                      MRN:          [id number]  DATE:09/22/2007                            DOB:          1950-05-08    [name] returns today for followup.   HISTORY OF PRESENT ILLNESS:  He has known coronary artery disease with  previous bypass surgery.  He was hospitalized in March for EP cath,  which showed an occluded graft to the PDA.   He has moderate right renal artery stenosis that is being followed by  duplex.  He has significant peripheral vascular disease with inflow  disease on the right.  He was seen by Dr. Excell Seltzer and just had right  external iliac stenting on September 03, 2007.  He was discharged on Plavix,  since that time he has had some apparent phlebitis in the left upper  extremity from his IV site and it is not erythematous and it is  resolving.  He has had significant relief in his claudication in the  right lower extremity.   He has no nonhealing ulcers and his pain is improved.   The patient unfortunately continues to smoke.  He smokes anywhere from  half a pack a day to a pack a day.  Reviewing his record, he has  previously been tried on Wellbutrin and Chantix; the Chantix in  particular made him worse.   I explained the relationship between his PVD, coronary artery disease,  and the fact that his grafts would not last very long, nor the stent, if  he continues to smoke.  After much discussion, Blaine and I decided to  start him on a nicotine patch at 14 mg a day.   The patient will take this patch for 4 weeks and then try to cut back to  a 7 mg patch.  He was cautioned not to wear the patch and smoke at the  same time.   Frankly I told Devontre that he is going to be in for many many problems,  if he cannot stop smoking, both in terms of further saphenous vein graft  failure and increasing PVD with stent restenosis.   REVIEW OF  SYSTEMS:  Outside of the phlebitis, the left upper extremity  is negative.   MEDICATIONS:  He is on,  1. Folic acid.  2. Lopressor 25 b.i.d.  3. An aspirin a day.  4. Xanax 0.5 t.i.d.  5. Zocor 40 a day.  6. Omega-3.  7. Vitamins.  8. Pletal 100 b.i.d.  9. Plavix 75 a day.   PHYSICAL EXAMINATION:  GENERAL:  Remarkable for a middle-aged white  male, looking older than his stated age.  VITAL SIGNS:  Weight is 161, blood pressure 125/79, pulse 65 and  regular, respiratory rate 14, afebrile.  NECK:  No carotid bruits.  No lymphadenopathy, no thyromegaly, no JVP  elevation.  LUNGS:  Clear.  Good diaphragmatic motion.  No wheezing.  S1 and S2 with  a soft systolic murmur.  PMI normal.  ABDOMEN:  Benign.  Bowel sounds positive.  No  AAA, no tenderness, no  bruit, no hepatosplenomegaly, no hepatojugular reflux, no tenderness.  EXTREMITIES:  He has already have bilateral femoral bruits.  Distal  pulses are intact.  No edema.  NEURO:  Nonfocal.  SKIN:  Warm and dry.  MUSCULOSKELETAL:  No muscular weakness.  Circulation is improved on the  right side.   IMPRESSION:  1. Smoking cessation.  The patient counseled at length, nicotine patch      given, followup in 3 months.  2. Coronary artery disease, previous coronary artery bypass graft with      failed graft to the posterior descending artery.  Currently not      having chest pain.  Continue aspirin, Plavix and beta-blocker.  3. Peripheral vascular disease with recent right external iliac stent      per Dr. Excell Seltzer.  Followup ankle-brachial index in 3 months.      Continue Pletal for now.  4. Previous hypertension, currently well controlled.  Lisinopril      stopped due to postural symptoms.  Continue beta-blocker and low-      salt diet.  5. 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  Continue Zocor 40 a      day.  6. Left upper extremity phlebitis, seems to be resolving, no erythema.      No need for treatment at this time.  He  will call me if he seems to      feel that he has gotten worse.  I suspect it was related to an IV      at the time of his procedure.     [name]. [name], MD, [facility]  Electronically Signed    PCN/MedQ  DD: 09/22/2007  DT: 09/22/2007  Job #: [id number]

## 2010-06-13 NOTE — Cardiovascular Report (Signed)
NAMEBUNYAN, BRIER NO.:  [id number]   MEDICAL RECORD NO.:  [id number]          PATIENT TYPE:  INP   LOCATION:  2915                         FACILITY:  [facility]   PHYSICIAN:  Bevelyn Buckles. Bensimhon, MDDATE OF BIRTH:  12/23/1950   DATE OF PROCEDURE:  04/21/2007  DATE OF DISCHARGE:                            CARDIAC CATHETERIZATION   PRIMARY CARDIOLOGIST:  Theron Arista C. [name], MD, [facility].   PATIENT IDENTIFICATION:  Mr. Strassman is a very pleasant 60 year old male  with multiple cardiac risk factors including ongoing tobacco use.  He  had a coronary artery disease and is status post bypass surgery in April  2008.  Since that time he has been doing fairly well, been quite active  without any angina.  Recently his lisinopril was increased from 5 mg a  day to 20 mg a day.  Subsequent to that,  he did have an episode of  presyncope while bringing wood into the house.  This was followed by  sharp substernal chest pain.  He is admitted.  He ruled out for  myocardial infarction with serial cardiac markers and he was brought for  cardiac catheterization.   PROCEDURES PERFORMED:  1. Selective coronary angiography.  2. Left heart cath.  3. Left ventriculogram.  4. Saphenous vein graft angiography x2.  5. Left internal mammary artery angiography.  6. Abdominal aortogram.   DESCRIPTION OF PROCEDURE:  The risks and indication of the procedure  were explained.  Consent was signed and placed on the chart.  A 5-French  arterial sheath was placed in the right femoral artery using a modified  Seldinger technique.  A JL-4 was used to image the left coronary system,  JR-4 was used to image the right coronary system and the saphenous vein  graft to the PDA, IM catheter was used to image the LIMA as well as the  saphenous vein graft to the OM.  A pigtail catheter was used for left  ventriculogram and abdominal aortogram.  All catheter exchanges were  made over a wire and there were no apparent  complications.  Of note, we  did use a Wholey wire to get up through the femoral and iliac systems.   Central aortic pressure is 183/89 with a mean of 122.  LV pressure is  190/11 with an EDP of 23.  There is no aortic stenosis.   Left main had an ostial 95% stenosis.   LAD had some moderate diffuse disease proximally and distal vessel was  filling from competitive flow.   The left circumflex gave off small OM-1, a moderate-sized OM-2 and a  small OM-3.  There appeared to be a totally occluded proximal marginal  as well.   Right coronary artery was a large dominant system.  It was diffusely  diseased and had a long 50% ostial lesion with 40% stenosis throughout  the midsection.  It gave off a right ventricular branch, a large PDA and  several posterolaterals.  There was a tubular 60% lesion in the mid PDA.   The saphenous vein graft to the PDA was totally occluded proximally.  The saphenous  vein graft to the OM was widely patent.  The LIMA to the  diagonal was patent with a jump graft to the LAD.  This was also patent,  however, the native LAD had diffuse distal disease with sluggish flow  especially distally.   Left ventriculogram done in the RAO position showed an EF of  approximately 50%, no regional wall motion abnormalities were  appreciated.   Abdominal aortogram showed a diffuse abdominal aortoiliac disease with  no aneurysm.  The right renal artery appeared to have a 70-80% proximal  stenosis.  The left renal artery was okay.  There was apparent severe  disease in the right superficial femoral artery.   ASSESSMENT:  1. Three-vessel native coronary artery disease as documented above.  2. The sequential left internal mammary artery graft to the diagonals      and left anterior descending is widely patent.  3. The saphenous vein graft to the obtuse marginal is patent.  4. The saphenous vein graft to the posterior descending artery is      totally occluded.  5. Ejection  fraction of approximately 50%.  6. Peripheral arterial disease with question of 80% right renal artery      stenosis and significant right superficial femoral artery disease.   PLAN/DISCUSSION:  Mr. Halbig does have some native RCA disease with an  occluded graft to that system but I do not think that this is flow-  limiting.  He has had normal enzymes.  At this point I think the best  pathway is to continue with aggressive risk factor management including  treatment of his hypertension and smoking cessation.  Possibly consider  PAD consult with Dr. Excell Seltzer.  He may be able to go home today, if not  tomorrow, depending on his blood pressure and his groin.      Bevelyn Buckles. Bensimhon, MD  Electronically Signed     DRB/MEDQ  D:  04/21/2007  T:  04/21/2007  Job:  [id number]

## 2010-06-13 NOTE — Assessment & Plan Note (Signed)
[facility]                            CARDIOLOGY OFFICE NOTE   [name], [name]                      MRN:          [id number]  DATE:12/22/2007                            DOB:          [birth year]    [name] returns today for followup.   He is status post coronary artery bypass surgery.  He has an occluded  graft to the RCA with moderate native RCA disease.   His vein graft to the OM and vein graft to the diagonal and to the LAD  were patent on catheterization on April 21, 2007.  He is not having  chest pain.  He has significant fatigue and malaise.  He is on statin  drug.  He needs to have followup LFTs, otherwise, he has had improvement  in his lower extremity claudication after having stenting of the right  iliac.   REVIEW OF SYSTEMS:  Otherwise, negative.   MEDICATIONS:  He is on folic acid a mg a day, Lopressor 25 b.i.d., Xanax  0.5 t.i.d., Zocor 40 a day, omega-3, and Plavix 75 a day.   PHYSICAL EXAMINATION:  GENERAL:  Remarkable for a middle-aged white male  with multiple tattoos.  Affect is appropriate.  VITAL SIGNS:  Weight is 160, blood pressure 137/80, pulse 65 and  regular, respiratory rate 14, and afebrile.  HEENT:  Unremarkable.  NECK:  Carotids are normal without bruit.  No lymphadenopathy,  thyromegaly, or JVP elevation.  LUNGS:  Clear.  Good diaphragmatic motion.  No wheezing.  HEART:  S1 and S2.  Normal heart sounds.  PMI normal.  ABDOMEN:  He has a large midline incision that extends up to his  sternotomy site.  No AAA.  No tenderness.  No bruit.  EXTREMITIES:  He does have residual right femoral bruit.  Popliteals are  +2 bilaterally.  PTs were difficult to feel.  NEUROLOGIC:  Nonfocal.  SKIN:  Warm and dry.  MUSCULOSKELETAL:  No muscular weakness.   IMPRESSION:  1. Coronary artery disease.  Previous coronary artery bypass grafting      with failed graft to the right coronary artery.  Continue aspirin,      Plavix,  and beta-blocker.  Currently, not having angina.  2. Peripheral vascular disease with stent to the right iliac.      Currently, good ambulation.  We will continue Pletal.  3. Hypercholesterolemia.  Continue statin.  Check liver profile today.  4. Malaise and fatigue.  Not likely related to medication, although at      some point in the future if      it continues, we may have to give him a holiday from the statin and      beta-blocker and see if he improves.  Follow up with Dr. Jacqulyn Bath for      his primary care needs.     [name]. [name], MD, [facility]  Electronically Signed    PCN/MedQ  DD: 12/22/2007  DT: 12/23/2007  Job #: [id number]

## 2010-06-13 NOTE — Assessment & Plan Note (Signed)
[facility]                       [place] CARDIOLOGY OFFICE NOTE   NAJIR, ROOP                      MRN:          [id number]  DATE:08/28/2006                            DOB:          Apr 15, 1950    Mr. Maltese returns today for follow-up.  I have not seen him since his  hospitalization at the end of April.  I did his heart cath.  He had a  ruptured plaque in his left main and needed urgent surgery by Dr.  Tyrone Sage.  He had LIMA sequentially to the second diagonal, distal LAD  vein graft to the first obtuse marginal and vein graft to the PDA.   Patient has done well since his surgery.  He has not had any recurrent  chest pain.  At the time of his discharge, his EF was 45%. This needs to  be reevaluated.   He stopped his Zocor five days ago due to hip pain.  He thinks it is  already improved.  I told him it would be important to reassess this in  regards to his bypass grafts.  I do not have his lab work from the  hospital but I suspect his LDL cholesterol was high.   He has been compliant with his other medications.  He is actually  working some, bailing hay.   He is not smoking.   REVIEW OF SYSTEMS:  Otherwise negative.   CURRENT MEDICATIONS:  1. Folic acid 1 mg a day.  2. Lopressor 25 b.i.d.  3. Lisinopril 5 a day.  4. We will start him on Crestor 5 mg a day to see if he tolerates      this.   PHYSICAL EXAMINATION:  VITAL SIGNS:  Weight is 159, blood pressure  110/80, respiratory rate 14, pulse 60 and regular, he is afebrile.  GENERAL APPEARANCE:  A healthy-appearing middle-aged white male in no  distress. Affect is appropriate.  HEENT:  Normal.  NECK:  Carotids normal without bruits.  There is no JVP elevation, no  lymphadenopathy, no thyromegaly.  LUNGS:  Clear with good diaphragmatic motion, no wheezing.  There is no  evidence of residual pleural effusions.  Sternum is well healed.  CARDIOVASCULAR:  There is an S1 and S2 with  normal heart sounds.  There  is no rub.  PMI is normal.  ABDOMEN:  Benign.  Bowel sounds positive.  No AAA, no tenderness, no  hepatosplenomegaly or hepatojugular reflux.  EXTREMITIES:  Distal pulses are intact with no edema.  Previous right  endoscopic harvest site is well healed with no evidence of thrombosis.  NEUROLOGIC:  Nonfocal.  There is no muscular weakness.   IMPRESSION:  1. Coronary artery bypass surgery, recovering well, already back to      work.  No limitations.  He does not need cardiac rehab at this      time.  There is no evidence of residual effusion and no arrhythmia.  2. Hypertension, currently stable.  Continue lisinopril 5 mg a day and      Lopressor.  3. 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  Follow-up echo in three months.  Continue ACE      inhibitor.  He is currently euvolemic and not having any paroxysmal      nocturnal dyspnea or orthopnea.  No need for a diuretic.  4. On folic acid, probable from discharge with anemia and elevated      MCV.  Probably continue for another two months.  Will recheck      hemoglobin and hematocrit at the time of his follow-up echo.  5. Question adverse effect to Zocor with myalgias and pain in the      hips. Crestor samples 5 mg given to try to take the Statin drug      given his bypass grafts.  He will call me if he does not tolerate      it, otherwise he will start taking this instead of the Zocor.  I      will see him back in three months.     [name]. [name], MD, [facility]  Electronically Signed    PCN/MedQ  DD: 08/28/2006  DT: 08/29/2006  Job #: [id number]

## 2010-06-13 NOTE — H&P (Signed)
NAMECORDAY, WYKA NO.:  [id number]   MEDICAL RECORD NO.:  [id number]          PATIENT TYPE:  INP   LOCATION:  2915                         FACILITY:  [facility]   PHYSICIAN:  Lorain Childes, MD DATE OF BIRTH:  Jun 21, 1950   DATE OF ADMISSION:  04/19/2007  DATE OF DISCHARGE:                              HISTORY & PHYSICAL   PRIMARY CARDIOLOGIST:  Theron Arista C. [name], MD, [facility]   PRIMARY CARE PHYSICIAN:  Dr. Jacqulyn Bath in [place], [place].   CHIEF COMPLAINT:  1. Chest pain.  2. Syncope.   HISTORY OF PRESENT ILLNESS:  The patient is a 60 year old gentleman with  known coronary disease status post CABG in April 2008 with a LIMA to the  LAD and  jump graft to D2, vein graft to OM, and vein graft to PDA, who  presents to the ER with complaints of chest pain.  The patient and his  girlfriend state that this evening he began having chest pain around 9  p.m.  He was up and standing and this happened and then he fell.  When  he came back to, his dog was licking his face.  He called his girlfriend  and told her that he felt like an elephant is sitting on his chest.  He  also had shortness of breath and some radiation of the pain to his neck.  No nausea, vomiting, or diaphoresis.  He reported palpitations along  with this syncopal event.  He denied any bowel or bladder incontinence  or seizure activity, and no one was home with him when this event  occurred.  His girlfriend told him to call 911.  He called 911 and they  brought him to the emergency room.  When he was seen by EMS, he was  having 10/10 chest pain, which was brought down to 0/10 with sublingual  nitroglycerin.   PAST MEDICAL HISTORY:  1. CAD status post cardiac catheterization after an abnormal stress      test in April 2008.  His left main had 95% proximal lesion, his LAD      had 90% lesion in addition to the proximal left main lesion.  He      had evidence of a ruptured plaque.  His D1 and D2 were  patent.  His      circumflex had  30% mid.  His RCA had 70% disease.  His EF was 45%.      He underwent a CABG by Dr. Tyrone Sage on the date of admission, which      was on May 26, 2006 with a LIMA to the LAD, D2 vein graft to LM,      and a vein graft to PDA.  He recovered from that and had been doing      well up until this event.  2. Hypertension.  3. Tobacco abuse, which is ongoing.  4. Dyslipidemia.  5. Peptic ulcer disease.  6. History of polysubstance abuse with abuse of Xanax, Ativan,      alcohol, and marijuana.  7. Recent viral gastroenteritis, with nausea, vomiting, and diarrhea  for the past week.  His symptoms have been improving, and he has      only had 1 bowel movement and 2 episodes of emesis today.  He      states that he is trying to maintain p.o. intake despite his      symptoms.  He has been drinking moderate amount of Gatorade.   SOCIAL HISTORY:  He lives in [place] with his girlfriend.  He is  disabled.  He has a 60-plus-pack-year tobacco.  He drinks 3-4 beers over  the past couple of weeks, down significantly from his previous usage.  Denies any drug abuse currently.   FAMILY HISTORY:  Mother died of heart failure at age 77.  Father died  from cancer.  He has a sister who had an MI in her 87s.   MEDICATIONS:  1. Aspirin 325 mg p.o. daily.  2. Metoprolol 25 mg p.o. b.i.d.  3. Lisinopril was 0.5 mg daily, was increased to 20 mg yesterday with      the first dose today by the primary care physician at a routine      physical.  4. Zetia 10 mg p.o. daily.  5. Folate 1 mg p.o. daily.  6. Voltaren gel.  7. Xanax 0.5 mg p.o. and p.r.n.    He has no known drug allergies.   REVIEW OF SYSTEMS:  He denies any fevers or chills.  No headache or  visual changes.  No skin rashes or lesions.  He describes chest pain as  stated in the HPI.  He does report dyspnea on exertion at 1 block.  No  orthopnea or PND.  No edema.  He reports palpitations in the  syncopal  event, which occurred today.  No prior episodes.  He does have a cough  with yellow-green sputum production, which has been occurring over the  past several days and some wheezing.  He denies any urinary symptoms.  NEUROPSYCH:  He denies any focal weakness or numbness.  GI:  Denies any  vomiting or diarrhea as stated above.  He denies any bright red blood  per rectum.  No melena.  No hematemesis.  He describes dysphagia, which  has occurred 2 times over the past couple of weeks according to his  girlfriend.  No odynophagia.  No change in his bowel habits.  All other  systems are negative.   PHYSICAL EXAMINATION:  Temperature is 98.1, pulse 56, respirations 14,  blood pressure 104/62, satting 96% on 2 L.  GENERAL:  He is well developed and well nourished in no acute distress.  HEENT:  Normocephalic and atraumatic.  NECK:  JVP  is approximately 6 cm, 2+ carotid upstroke.  There are no  bruits present.  CARDIOVASCULAR:  Normal S1 and S2.  There is no S3 or S4.  PMI is  nondisplaced.  His heart sounds are regular, he has no murmurs  appreciated.  LUNGS:  Rhonchorous.  He has no rales.  There is no wheezing present.  He has had decreased breath sounds at the bases.  ABDOMEN:  Soft and nontender.  No organomegaly.  EXTREMITIES:  He has no edema.  Pulses are intact and symmetric.  NEUROLOGIC:  He is alert and oriented.  Cranial nerves are grossly  intact.  His strength is 5/5, upper extremities and lower extremities  bilaterally.    Chest x-ray showed a possible left upper lobe infiltrate.  EKG shows  rate of 58 sinus rhythm.  His PR interval 154 msec.  QRS is 108 msec.  QTc is 434 msec.  He has late transition across the precordial leads and  slight ST depression in V5.  He has an isolated Q in aVL.  His EKG is  different from his EKG dated May 23, 2006.  In that, his transition  was normal at that time and his QRS now has widened slightly from 98 to  108 msec.  His white  count is 8.3, hematocrit 39, platelets 396;  potassium 3.2, glucose is 81, and creatinine is 0.9.  Point of care:  CK-  MB is less than 1, troponin less than 0.05, myoglobin is 22.1, and D-  dimer is normal at 0.22.   ASSESSMENT AND PLAN:  The patient is a 60 year old gentleman with known  coronary artery disease status post coronary artery bypass graft, now he  was admitted to the emergency room with complaints of chest pain and  syncopal events.  1. Coronary artery disease.  His history is definitely concerning for      angina.  Will admit him to stepdown status, cycle his enzymes and      follow his EKG.  We will continue him on beta-blocker, statin,      angiotension-converting enzyme inhibitor, and also Lovenox.  We      will plan for  cardiac catheterization likely on Monday.  2. Syncope.  His etiology is unclear at this point.  He has multiple      contributing factors, which may lead to syncope.  We will monitor      him on telemetry for an arrhythmia.  His hypotension may be related      to his changes of his medication in the setting of his recent      illness.  His lisinopril dose was increased from 5 mg to 20 mg      yesterday, and he took his first dose of this today which likely      led to further hypotension.  His girlfriend states that his blood      pressure has been monitored at home and has been ranging in the 90s      to 110s over 75 with change of his medications.  In addition, he      has had this viral illness with nausea, vomiting, and diarrhea,      which is likely related to intravascular volume depletion.  We will      check orthostatics, we will start him on some fluids.  Likely,      since he had syncopal episode, which was unwitnessed, and I am      unsure regarding head trauma.  He is a bit somnolent, however, he      answers questions appropriately once prompted.  We will perform a      head CT to ensure there is no acute bleed that we are unaware of.   3. Tobacco abuse.  I have encouraged the complete cessation, and place      a tobacco cessation consult.  4. Possible pneumonia.  The patient does report cough and sputum      production.  His white count is normal.  His chest x-ray shows      possible infiltrate, we will start moxifloxacin for coverage.  5. Alcohol abuse.  He has decreased his alcohol intake significantly,      lately he has only been drinking 3-4 beers for the past couple of      weeks per  his girlfriend.  We will monitor him for any evidence of      withdrawal.      Lorain Childes, MD  Electronically Signed     CGF/MEDQ  D:  04/20/2007  T:  04/20/2007  Job:  [id number]

## 2010-06-13 NOTE — Discharge Summary (Signed)
[name], [name] NO.:  [id number]   MEDICAL RECORD NO.:  [id number]          PATIENT TYPE:  INP   LOCATION:  2028                         FACILITY:  [facility]   PHYSICIAN:  Bevelyn Buckles. Bensimhon, MDDATE OF BIRTH:  1950/07/13   DATE OF ADMISSION:  04/19/2007  DATE OF DISCHARGE:  04/21/2007                               DISCHARGE SUMMARY   PRIMARY CARDIOLOGIST:  Theron Arista C. [name], MD, [facility]   PRIMARY CARE PHYSICIAN:  [place], [place], in [place].   DISCHARGE DIAGNOSIS:  Chest pain.   SECONDARY DIAGNOSES:  1. Coronary artery disease status post coronary artery bypass grafting      in April 2008.  2. Hypertension.  3. Hyperlipidemia.  4. Ongoing tobacco abuse.  5. Peripheral vascular disease.  6. History of dysphasia.  7. History of polysubstance abuse.  8. Anxiety.   ALLERGIES:  No known drug allergies.   PROCEDURE:  Left heart cardiac catheterization.   HISTORY OF PRESENT ILLNESS:  This 60 year old Caucasian male with prior  history of CAD status post CABG in April 2008 who was in his usual state  of health until the evening of April 19, 2007, when he had sudden onset  of substernal chest heaviness with shortness of breath and radiation to  his neck associated with nausea, diaphoresis, palpitations and  presyncope; 9-1-1 was then called.  The patient was taken to the [facility] ED for further evaluation.  En route, he received sublingual  nitroglycerin with complete relief of discomfort.  In the ED, ECG showed  no acute changes, and his D-dimer was normal with normal point-of-care  cardiac markers.  The patient was admitted for rule out and further  evaluation.   HOSPITAL COURSE:  The patient ruled out for MI, and decision was made to  pursue left heart cardiac catheterization given the similarity of his  current symptoms to prior angina.  Left heart catheterization was  performed April 21, 2007, revealing patent LIMA to the LAD and diagonal,  patent vein  graft to the obtuse marginal, and an occluded vein graft to  PDA.  He had a native 95% left main stenosis and diffuse nonobstructive  disease in the right coronary artery.  EF was 50%.  There were no  targets for intervention.  It was also noted the patient had significant  peripheral vascular disease with 70-80% stenosis in the right renal  artery and appearance of insignificant aortoiliac disease with apparent  severe disease in the right superficial femoral artery.  Upon further  questioning, the patient reports a fairly long history of right hip pain  and claudication with ambulation, resolving with rest.  In the past this  has been chalked up to his statin intolerance, although he has never  had any rest symptoms which might be more typical of myalgias secondary  to statins.  As a result, we have advised him to continue aspirin  therapy, and we have reinitiated simvastatin given his significant  vascular disease.  The patient has been ambulating post catheterization  without recurrent symptoms and will be discharged home today in good  condition.   DISCHARGE LABORATORY DATA:  Hemoglobin 12.5, hematocrit 37.2, WBC 7.6  platelets 347,  MCV 93.0. Sodium 141, potassium 4.0, chloride 110, CO2  25, BUN 7, creatinine 0.80, glucose 108. INR 1.0. Total bilirubin 0.3,  alkaline phosphatase 71, AST 20, ALT 15.  Amylase 64, lipase 16. Albumin  3.2.  Cardiac markers negative x3.  Total cholesterol 143, triglycerides  100, HDL 31, LDL 92. Calcium 9.1.  Drug screen was positive for  marijuana and benzodiazepines.  TSH was 0.728. D-dimer was less than  0.22.  Alcohol level was high at 179.   DISPOSITION:  The patient is being discharged home today in good  condition.   FOLLOWUP PLANS AND APPOINTMENTS:  He has followup with Dr. [name] on  April 10 in our [place] office, and we will arrange for peripheral  vascular followup with Dr. Excell Seltzer in our [place] office.  He is asked  to follow up  with Lindaann Pascal, PA, at [facility]  in the next 3-4 weeks.   DISCHARGE MEDICATIONS:  1. Aspirin 325 mg daily.  2. Lisinopril 20 mg daily.  3. Folic acid 1 mg daily.  4. Avelox 400 mg daily (initiated secondary to question of left upper      lobe infiltrate).  5. Nitroglycerin 0.4 mg sublingual p.r.n. chest pain.  6. Zocor 40 mg nightly.  7. Metoprolol 25 mg b.i.d.  8. Fish oil 3 times a day.  9. Xanax 0.5 mg 3 times a day p.r.n.   The patient has been counseled on the importance of complete cessation  of smoking of both tobacco and marijuana as well as reducing alcohol  intake.   OUTSTANDING LABORATORY STUDIES:  None.   DURATION OF DISCHARGE ENCOUNTER:  45 minutes including physician time.      Nicolasa Ducking, ANP      Bevelyn Buckles. Bensimhon, MD  Electronically Signed    CB/MEDQ  D:  04/21/2007  T:  04/21/2007  Job:  [id number]   cc:   Lindaann Pascal, PA

## 2010-06-13 NOTE — Assessment & Plan Note (Signed)
[facility]                       [place] CARDIOLOGY OFFICE NOTE   ILYA, NEELY                      MRN:          [id number]  DATE:11/14/2006                            DOB:          02-06-1950    Jadriel returns today for a followup.  He is a patient I saw initially  with an unstable coronary syndrome and a ruptured plaque in his left  main.  He had successful coronary artery bypass graft surgery in April.  He has been doing well since then.  Unfortunately, he started smoking  again.  When talking to Tag he said that he gets stressed and gets  some insomnia and goes back to his cigarettes.  He has been smoking  again for about 2 months.  I talked to him at length about this and the  risk in regards to premature graft failure as well as lung cancer and  COPD.  We will try to give him some generic Wellbutrin since he is a  Medicaid patient to try to get him to stop smoking again.   He was counseled for less than 10 minutes about this.   The patient otherwise is doing well.  He has hypercholesterolemia and  hypertension and is on therapy.  He is not having significant chest  pain.  His sternotomy site is a little bit sore but tends to be relieved  with analgesics.   REVIEW OF SYSTEMS:  Otherwise remarkable for a fall a few weeks ago.  He  had a significant ecchymosis on his right elbow which continues to be a  little bit sore.  He has finished cardiac rehab.  Review of systems  otherwise negative.   He is currently taking:  1. Folic acid 1 mg a day.  2. Lopressor 25 b.i.d.  3. Lisinopril 5 day.  4. An aspirin a day.  5. Symbicort 500/20.   EXAMINATION:  Remarkable for a healthy-appearing middle-aged white male  who looks older than the stated age.  Weight is 158, blood pressure is  134/82, pulse 62 and regular, respiratory rate is 14, he is afebrile.  HEENT:  Unremarkable.  NECK:  Supple, there is no carotid bruits, no  lymphadenopathy, no  thyromegaly and no JVP elevation.  LUNGS:  Clear with no active wheezing and good diaphragmatic motion.  There is an S1-S2 with normal heart sounds, PMI is normal.  Sternotomy  is well-healed.  Bowel sounds are positive with no tenderness, no AAA,  no bruit, no hepatosplenomegaly, hepatojugular reflux.  Femorals were +3  bilaterally without bruit.  PTs are +3, there is no lower extremity  edema.  Venotomy sites were well-healed, particularly above the right  knee.   IMPRESSION:  1. Coronary disease status post coronary artery bypass grafting,      currently stable.  No evidence of recurrent angina.  Check left      ventricular function in 6 months.  2. Hypertension currently well-controlled, continue current dose of      lisinopril, could certainly tolerate more in the future.  3. Smoking, Wellbutrin 150 mg p.o. to be taken once a day.  Given      followup in 3 months to further assess smoking cessation.  4. Hypercholesterolemia in the setting of recent coronary artery      bypass grafting.  Continue Symbicort 500/20, followup lipid and      liver profile in 6 months.  5. Recent fall to the right elbow.  He may actually have a little bit      of fluid in the olecranon bursa, he has full range of motion.  He      will follow up with Dr. Jacqulyn Bath for this.  It was x-rayed at the time      of the fall and there was no fracture.     [name]. [name], MD, [facility]  Electronically Signed    PCN/MedQ  DD: 11/14/2006  DT: 11/15/2006  Job #: [id number]

## 2010-06-13 NOTE — Progress Notes (Signed)
[facility]                        PERIPHERAL VASCULAR OFFICE NOTE   [name], [name]                      MRN:          [id number]  DATE:05/13/2007                            DOB:          1950-11-11    PRIMARY CARDIOLOGIST:  Dr. Charlton Haws.   PRIMARY CARE PHYSICIAN:  Dr. Jacqulyn Bath.   REASON FOR CONSULTATION:  Claudication.   HISTORY OF PRESENT ILLNESS:  [name] is a 60 year old gentleman with  coronary and peripheral arterial disease.  He presented with chest pain  last month and underwent a cardiac catheterization.  He is one year out  from bypass surgery.  He had complained of claudication symptoms, so an  aortogram was performed at the time of his diagnostic catheterization.  This demonstrated moderate iliac stenosis on the right as well as a  moderate right renal artery stenosis.   [name] complains of right leg pain ever since his bypass surgery.  He  has several types of leg pain.  He complains of lower back and right hip  pain that can occur at rest or with walking.  He also has some pain down  the medial aspect of the right thigh that seems worse since surgery.  His back and hip pain are exacerbated by certain movements.  He also has  an aching pain in the right calf with walking that occurs at  approximately 100 yards and at a much shorter distance when walking up  hill.  He has to stop several times when walking up a hill.  His  symptoms have been relatively stable over the last year.  He has no rest  pain.  He has had no ulcerations or skin problems in the feet.  He  denies a history of stroke or TIA.   CURRENT MEDICATIONS:  Include:  1. Folic acid 1 mg daily.  2. Lopressor 25 mg twice daily.  3. Aspirin 325 mg daily.  4. Xanax 0.5 mg t.i.d.  5. Zocor 40 mg daily.  6. Lisinopril 20 mg daily.  7. Fish oil 1 g 3 times daily.   PAST MEDICAL HISTORY:  Is pertinent for:  1. Coronary artery disease with severe left main  stenosis ultimately      requiring multivessel bypass surgery one year ago by Dr. Tyrone Sage.  2. Peripheral arterial disease with lower extremity claudication and      right renal artery stenosis.  3. Hypertension.  4. Tobacco abuse.  Discontinued approximately three weeks ago.  5. Dyslipidemia.  6. Peptic ulcer disease.  7. History of polysubstance abuse.   SOCIAL HISTORY:  The patient lives in [place] with his girlfriend.  He is on disability.  He has a longstanding tobacco history.  He has a  history of heavy alcohol use but has cut back over the last several  weeks and is only drinking occasional beer at present.  No recent  history of drug use.   FAMILY HISTORY:  Is pertinent for congestive heart failure in his mother  who died at age 28.  Father died from cancer.  He has a sister who had a  myocardial infarction in her 35s.   REVIEW OF SYSTEMS:  Complete 12-point review of systems was performed.  All systems were negative except as detailed above.   PHYSICAL EXAMINATION:  The patient is alert and oriented.  He is in no  distress.  Weight 161 pounds, blood pressure 110/78 on the right -  116/80 on the left, heart rate 64, respiratory rate 16.  HEENT:  Normal.  NECK:  Normal carotid upstrokes with a soft left carotid bruit.  No  bruit on the right.  Jugular venous pressure normal.  No thyromegaly or  thyroid nodules.  LUNGS:  Clear to auscultation bilaterally.  HEART:  The apex is discrete and nondisplaced.  The heart is regular  rate rhythm without murmurs or gallops.  ABDOMEN:  Soft and nontender.  No organomegaly.  No bruits.  BACK:  No CVA tenderness.  EXTREMITIES:  Peripheral pulses are 2+ in the femoral arteries.  There  is a right greater than left femoral bruit.  There is no clubbing,  cyanosis or edema.  Pedal pulses are 2+ in the left posterior tibial, 1+  in the right posterior tibial, dorsalis pedis are not palpable.  SKIN:  Warm and dry without rash.  No  areas of skin breakdown.  NEUROLOGIC:  Cranial nerves II-XII are intact.  Strength 5/5 and equal.   ASSESSMENT:  This is a 59 year old gentleman with lower extremity  peripheral arterial disease manifested mainly with right calf  claudication.  I suspect [name] has right superficial femoral artery  stenosis.  I reviewed his angiogram.  He does have moderate iliac  stenosis involving the bifurcation of the internal and external iliacs.  There is significant irregularity of the artery in that region.  I think  his back and hip pain is likely nonvascular based on his description and  the fact that it is not closely related to exertion.  His calf  claudication is classic for vascular pain.  In any event, I think it  would be helpful to quantify the significance of his PAD with an  assessment by Duplex ultrasound.  I will check ABIs with lower extremity  duplex study and give him a trial of Pletal.  He will take 100 mg daily  for 2 weeks followed by 100 mg twice daily.  Of note, he has not had any  problems with heart failure, and  his LVEF is 55%.   He also described a feeling of near syncope when turning his head to the  left.  In the setting of his vascular disease, I think we should check a  carotid ultrasound study to rule out vertebrobasilar insufficiency.   For follow-up, I will see [name] back in 3 months to assess his  response to the addition of Pletal.  I will review his Duplex studies of  his carotids and lower extremities as well.     Veverly Fells. Excell Seltzer, MD  Electronically Signed    MDC/MedQ  DD: 05/13/2007  DT: 05/13/2007  Job #: [id number]   cc:   [name]. [name], MD, [facility]  Lindaann Pascal, M.D.

## 2010-06-13 NOTE — Op Note (Signed)
NAMEBAYARD, MORE               ACCOUNT NO.:  [id number]   MEDICAL RECORD NO.:  [id number]          PATIENT TYPE:  AMB   LOCATION:  SDS                          FACILITY:  [facility]   PHYSICIAN:  Veverly Fells. Excell Seltzer, MD  DATE OF BIRTH:  10-28-1950   DATE OF PROCEDURE:  09/03/2007  DATE OF DISCHARGE:  09/03/2007                               OPERATIVE REPORT   PROCEDURES:  Abdominal aortography with runoff, right common iliac  angiography, and right external iliac stenting.   PROCEDUREAL INDICATIONS:  Mr. Docken is a 60 year old gentleman with  lifestyle-limiting intermittent claudication of the right leg.  He has  been assessed with noninvasive studies and has been found to have inflow  disease.  He has minimal left leg symptoms.  Due to the progressive  nature of his symptoms and suboptimal response to medical therapy, he  was brought in for angiography and possible PTA.   Risks and indications of the procedure were reviewed with the patient  and informed consent was obtained.  Bilateral groins were prepped and  draped.  The left groin was accessed via front wall puncture and a 5-  French sheath was placed in the common femoral artery.  A pigtail  catheter was advanced into the suprarenal abdominal aorta, and an  abdominal aortogram was performed under digital subtraction.  Pigtail  catheter was pulled back to the distal abdominal aorta, and an aortogram  with runoff to the feet was performed using a bolus-chase method.  This  demonstrated severe stenosis with ulcerated plaque of the right external  iliac artery.  This correlates with the patient's noninvasive findings  and symptoms.  I elected to proceed with PTA and stenting.  A crossover  catheter was used with a Wholey wire to access the contralateral iliac  artery.  The 5-French sheath was then changed out for a 7-French to room  of 45-cm sheath, which was taken across the iliac bifurcation into the  right common iliac artery.   The common iliac angiography was performed  with focus on the lesion in the external iliac.  The lesion was an  ulcerated plaque that began just beyond the origin of the internal  iliac.  Marker tape was used to determine the length of the lesion.  The  lesion length appeared to be 8 cm, and I elected to treat the vessel  with a 9 x 80 Protege self-expanding stent.  This was carefully  positioned but was clearly too long for the lesion and crossed over the  internal iliac artery.  That stent was removed and was not deployed.  Instead a 9 x 60 Protege stent was positioned and successfully deployed  to cover the entire lesion and was left just beyond the iliac  bifurcation.  The stent was then post-dilated with a 7 x 40 Synergy  balloon, which was taken to 8 atmospheres distally and 10 atmospheres  proximally.  There was an excellent angiographic result with a widely  expanded stent.  Angiography confirmed excellent flow and no significant  residual stenosis.  At that point, a spot image was performed  over the  lower leg to assure that there was no distal embolization.  This was  done under digital subtraction.  There remained 2-vessel runoff to the  foot with an unchanged appearance at the end of a severely diseased  anterior tibial artery.  The patient tolerated the procedure well, and  the Terumo sheath was removed using the dilator and a guidewire and  pulled back across into the left side.  The sheath will be removed once  the ACT is less than 175 seconds.  The patient tolerated the procedure  well and was transferred to the holding area in stable condition.   FINDINGS:  There are single renal arteries bilaterally.  The left renal  artery is widely patent.  The right renal artery has a 60-75% proximal  stenosis.  The aorta is diffusely diseased with an ulcerated-appearing  plaque in the distal aorta.  There is no significant obstructive disease  seen.   On the left side, the common  iliac artery is diffusely diseased.  The  ostium has a 30-40% stenosis.  The internal iliac and external iliac  arteries are widely patent.  There is calcification throughout the iliac  system on the left.  The left common femoral has mild stenosis of no  more than 30-40%.  The SFA is diffusely diseased, but there are no  significant stenoses.  The profunda is patent.  The popliteal is patent.  Below-the-knee predominant runoff to the foot is via the posterior  tibial.  The peroneal was also patent to the foot.  The anterior tibial  is severely diseased and does not reach the foot.   On the right side, the common iliac is calcified.  There is no  significant stenosis.  The external iliac has severe ulcerated plaque  with 80-90% stenosis is outlined.  The internal iliac is patent.  The  common femoral artery is patent.  The superficial femoral and popliteal  arteries are patent.  The SFA has areas of mild stenosis, but no  significant obstructive disease is noted.  Popliteal is patent.  The  anterior tibial is diseased and is occluded in its midportion, and the  posterior tibial is a large vessel with the supplies predominant runoff  to the foot.  The peroneal also reaches the foot.   FINAL CONCLUSIONS:  1. Severe right external iliac stenosis with successful stenting using      a self-expanding stent.  2. Moderate right renal artery stenosis.  3. A 2-vessel runoff bilaterally.   Mr. Gersten will be treated with 30 days of Plavix.  He should continue  aspirin lifelong.  Continue medical therapy for his coronary and  vascular disease.  We will follow up with ABIs in four weeks.      Veverly Fells. Excell Seltzer, MD  Electronically Signed     MDC/MEDQ  D:  09/03/2007  T:  09/04/2007  Job:  [id number]   cc:   [name]. [name], MD, [facility]  Dr. Lindaann Pascal

## 2010-06-13 NOTE — Assessment & Plan Note (Signed)
[facility]ALTHCARE                       [name] CARDIOLOGY OFFICE NOTE   [name], [name]                      MRN:          [id number]  DATE:06/19/2006                            DOB:          04-02-1950    [name] returns today for followup. I saw the patient in the hospital. I  performed his heart catheterization which showed significant left main  disease. He had a balloon pump placed and was operated on emergently by  Dr. Tyrone Sage.   The patient is a previous smoker with hypertension and hyperlipidemia.   His postop course was fairly unremarkable.   He did not have any significant arrhythmias. The patient tells me that  there is a question on his post hospital visit of an elevated platelet  count that needed followup.   Since his hospital discharge, he feels that he has been doing well. He  was initially weak but seems to be improving on a daily basis. He needs  to get hooked up with cardiac rehab. He has not  had a followup chest x-  ray.   REVIEW OF SYSTEMS:  Otherwise unremarkable.   CURRENT MEDICATIONS:  1. Iron b.i.d.  2. Folic acid 1 mg a day.  3. Lopressor 25 b.i.d.  4. Lisinopril 5 a day.  5. Zocor 40 a day.   PHYSICAL EXAMINATION:  GENERAL:  His exam is remarkable for having  multiple tattoos. His girlfriend, [name], is with him and he has  her name tattooed across the back of his right neck. The patient's  affect is normal. He is in no distress and he otherwise looks a little  bit older than his stated age.  VITAL SIGNS:  His blood pressure is excellent at 120/70, pulse is 70 and  regular. He is afebrile. Respiratory rate is 14. Weight is 156.  HEENT:  Normal. There are no carotid bruits, no JVP elevation, no  lymphadenopathy, no thyromegaly.  LUNGS:  Clear. There is no evidence of pneumothorax or effusion. There  is good diaphragmatic motion.  HEART:  There is an S1, S2 with normal heart sounds. There is no rub.  PMI is normal. His sternum is well healed.  ABDOMEN:  Benign. There is no AAA. Bowel sounds are positive. He had no  tenderness, no hepatosplenomegaly, no hepatojugular reflux.  EXTREMITIES:  Distal pulses are intact with no edema. His endoscopic  vein harvest site above the right knee has a bit of a cord and minor  tenderness but no erythema. PTs are +2 bilaterally.  NEUROLOGIC:  Nonfocal. There is no muscular weakness.   IMPRESSION:  CAD: Stable status post semi-emergent coronary artery bypass graft for  left main disease. Since he is postop from his CABG, he will have a  chest x-ray to make sure there is no effusions and that his sternum is  healing well.As I recall, he had only mildly decreased left ventricular  function on cath. He will need to have his left ventricular function  followed up. He will enroll in cardiac rehab.  His bypass included a  LIMA to the LAD diagonal, vein graft to the  first obtuse marginal, vein  graft to the PDA.   HTN: His blood pressure is under good control with Lopressor and  lisinopril.   Chol:  He will have a followup lipid and liver profile on Zocor at 40 mg  a day.   PLT's:  As per Dr. Dennie Maizes request, we will do a CBC and platelet  count and follow him for possible abnormally elevated platelet count.   So long as these tests are not abnormal, I will see him back in 8-12  weeks after he has been enrolled in cardiac rehab.   Overall, the patient is fairly lucky given the ruptured plaque and  extent of his left main disease.   He has recovered quite well from coronary artery bypass graft.     [name]. [name], MD, [facility]  Electronically Signed    PCN/MedQ  DD: 06/19/2006  DT: 06/19/2006  Job #: [id number]

## 2010-06-13 NOTE — Assessment & Plan Note (Signed)
[facility]ALTHCARE                       [place] CARDIOLOGY OFFICE NOTE   Cody, [name]                      MRN:          [id number]  DATE:05/12/2007                            DOB:          February 19, 1950    Returns today for follow-up.  He was hospitalized from March 21 to March  23 with chest pain.   He ruled out for myocardial infarction.  His heart cath showed an  occluded graft to the PDA with moderate native disease.  Medical therapy  was warranted.   The patient has PVD with right renal artery stenosis and question of  disease in the right iliac   Tawfiq has had a longstanding history of back pain and hip pain.  He  needs to have follow-up ABI's.  Apparently he already has an appointment  to see Dr. Excell Seltzer tomorrow.   From a cardiac perspective he is stable.  He is not having chest pain.  He has stopped smoking since hospital discharge. Prior to his hospital  admission we had tried him on Wellbutrin, but he had not stopped.  He is  currently off Wellbutrin   The patient is able to ambulate.  He sounds like he has more lower back  pain than true claudication.  I suspect his ABI's will not be that bad.   REVIEW OF SYSTEMS:  Remarkable for some anxiety.  He has been placed on  Xanax and needs a refill.  He does have a history of polysubstance  abuse, but I think that a short-term prescription of Xanax will be fine.  There is a recent thyroid workup.  I am not sure why he had a thyroid  scan.  His TSH was only 0.73.  Thyroid scan done this Tuesday at [facility] was normal.  Review of Systems, otherwise, negative.   CURRENT MEDICATIONS:  1. Folic acid.  2. Lopressor 25 b.i.d.  3. An aspirin a day.  4. Xanax 0.5 t.i.d. but I only refilled his prescription for 0.5      b.i.d. x1.  5. Zocor 40 a day.  6. Fish oil.  7. Lisinopril 20 a day.   PHYSICAL EXAMINATION:  GENERAL:  Remarkable for a thin, middle-aged  white male who looks  older than his stated age.  He has multiple  tattoos.  VITAL SIGNS:  Weight is 162, blood pressure 110/72, pulse 60 and  regular, afebrile, respiratory rate 14.  HEENT:  Unremarkable.  Carotids normal without bruit.  No  lymphadenopathy, thyromegaly, JVP elevation.  LUNGS:  Clear, good diaphragmatic motion.  He does have some chronic  bronchitis.  HEART:  S1, S2, normal heart sounds.  PMI normal.  ABDOMEN:  Protuberant.  Bowel sounds positive.  I cannot hear renal  bruit.  No AAA, no tenderness.  He has bilateral femoral bruits right  greater than left.  PT's are +1 bilaterally.  No lower extremity edema.  NEUROLOGICAL:  Nonfocal.  SKIN:  Warm and dry.  MUSCULOSKELETAL:  No muscular weakness.   IMPRESSION:  1. Recent hospitalization for chest pain, medical therapy for coronary      disease,  aspirin, and beta blocker.  Follow moderate native disease      in the right with an occluded graft to this vessel.  2. Hypertension currently well-controlled.  Low salt diet, continue      lisinopril.  3. Lower back pain, hip pain, question claudication with iliac disease      on catheterization.  Follow with Dr. Excell Seltzer tomorrow.  Will need      ABI's.  May need to switch beta blocker to Visken or some other      sympathomimetic drug.  4. Anxiety not related hyperthyroidism.  TSH and thyroid uptake scan      normal; p.r.n. Xanax, follow up with Dr. Jacqulyn Bath.  5. Hypercholesterolemia in the setting of coronary disease.  Continue      Zocor 40 a day.  6. Smoking cessation previously on Wellbutrin, but currently has      stopped cold [place] for two weeks.  Hopefully this will continue.      He at some point will probably need baseline pulmonary function      tests as clinically he has significant bronchitis.   I will see him back in six months.     [name]. [name], MD, [facility]  Electronically Signed    PCN/MedQ  DD: 05/12/2007  DT: 05/12/2007  Job #: [id number]

## 2010-06-16 NOTE — Cardiovascular Report (Signed)
NAMEJIMI, [name]               ACCOUNT NO.:  [id number]   MEDICAL RECORD NO.:  [id number]          PATIENT TYPE:  INP   LOCATION:  3731                         FACILITY:  [facility]   PHYSICIAN:  [name]. [name], MD, FACCDATE OF BIRTH:  [birth year]   DATE OF PROCEDURE:  DATE OF DISCHARGE:                            CARDIAC CATHETERIZATION   PROCEDURE:  Coronary arteriography.   INDICATIONS:  Substernal chest pain in a 60 year old patient and Myoview  suggesting decreased LV function.   DESCRIPTION OF PROCEDURE:  Cine catheterization was done from the right  femoral artery using 6-French catheters.   It became evident on first injection that the patient had an extremely  tight ostial or proximal left main; only 3 injections of the left system  were done due to catheter damping and severe left main disease.   The left main ostium and proximal portion had a 95% calcified and  eccentric lesion.  There also appeared to be a ruptured plaque in the  proximal LAD with a 90% stenosis.  The distal LAD filled normally and  there were 2 large diagonal branches which were normal.   Circumflex coronary artery appeared to be primarily a large obtuse  marginal branch and an AV groove branch.  There was 30% mid vessel  lesion.   The right coronary artery was dominant.  There was 70% diffuse disease  proximally, 30% to 40% disease distally and 30% disease in the PDA.  There were no right-to-left collaterals.   RAO VENTRICULOGRAPHY:  The patient had anteroapical wall hypokinesis,  but the EF was in the 45% range.  There is no gradient across the aortic  valve and no MR.  Aortic pressure is 174/88.  LV pressure is 180/14.   IMPRESSION:  Dr. Tyrone Sage was called and is currently in the room  reviewing the films.  The patient needs open heart surgery as soon as  possible.  Interestingly, his enzymes are negative.  He is currently not  having chest pain and I did not think an intra-aortic balloon  pump was  indicated.  He will be treated with heparin for his sheath, oxygen and  nitroglycerin and we will proceed with open heart surgery as soon as  feasible by [facility].      [name]. [name], MD, [facility]  Electronically Signed     PCN/MEDQ  D:  05/22/2006  T:  05/22/2006  Job:  [id number]

## 2010-06-16 NOTE — H&P (Signed)
NAMEARNAV, Nelson NO.:  [id number]   MEDICAL RECORD NO.:  [id number]          PATIENT TYPE:  INP   LOCATION:  3731                         FACILITY:  [facility]   PHYSICIAN:  Cody Faith, MD   DATE OF BIRTH:  1950/02/10   DATE OF ADMISSION:  05/20/2006  DATE OF DISCHARGE:                              HISTORY & PHYSICAL   PRIMARY CARE PHYSICIAN:  Unassigned.   CHIEF COMPLAINT:  Chest pain and fatigue for three days.   HISTORY OF PRESENT ILLNESS:  This is a 60 year old white man with no  cardiac history but with positive risk factors.  Prior to three days  ago, he felt very well and describes his health as excellent.  He has  been tearing down an old house.  Three days ago while working on this he  felt sudden onset  of chest tightness and like someone was squeezing him  in a vice around his chest.  He felt like he was unable to take a deep  breath.  He felt dizzy and diaphoretic and very lightheaded.  He was  also nauseous but did not vomit.  He transiently lost his vision and  felt like he was going to pass out but leaned against the car and  quickly regained consciousness.  This entire episode lasted 10-15  minutes and then the pain eased off.  Since then he has had intermittent  low-level chest tightness with mild shortness of breath.  He had felt  extremely fatigued for the past two to three days.  He has been sleeping  throughout the day and feels very washed out.  He says his chest pain is  currently a 2.   PAST MEDICAL HISTORY:  1. Peptic ulcers.  2. Polysubstance abuse - Xanax, Ativan, alcohol, marijuana.   ALLERGIES:  NONE.   MEDICINES:  He has been taking aspirin for the past three days,  otherwise none.   SOCIAL HISTORY:  He lives in [place] with his girlfriend.  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  He has smoked two packs a  day for 30 years.  Drinks at least a six-pack of beer a day.  Has not  used marijuana for a  few weeks but does regularly use this.  He denies  cocaine.   FAMILY HISTORY:  His mother died of CHF in her 15s.  Father died of  cancer.  He has a sister who had an MI in her 58s.  His father  apparently had an aortic aneurysm.   REVIEW OF SYSTEMS:  Positive for sweats, blurry vision, presyncope,  chest pain, shortness of breath, wheezing, nausea.  The balance of 10  systems is reviewed and is negative except for what is in the HPI.   PHYSICAL EXAMINATION:  VITAL SIGNS:  Temperature 98.1, pulse 75,  respiratory rate 20, blood pressure 145/81, saturation 99% on room air.  GENERAL:  This is a pleasant white male in no distress.  He was noted to  be diaphoretic.  He is not tremulous.  Does not appear overly anxious.  HEENT:  His pupils are round and reactive.  Sclerae are clear.  Extraocular movements are intact.  Dentition is fair.  Mucous membranes  are moist.  Oropharynx is without erythema or lesion.  NECK:  Supple without lymphadenopathy, JVD, or carotid bruit.  CARDIAC EXAM:  Normal rate, regular rhythm.  Normal S1 and S2.  No  gallop or murmur.  PULSES:  Dorsalis pedis and radial pulses are 2+ bilaterally.  LUNGS:  Clear to auscultation without wheezing or rales bilaterally.  ABDOMEN:  Soft, nontender, no bruits.  Normal bowel sounds.  No  hepatosplenomegaly, no rebound or guarding.  EXTREMITIES:  Reveal no edema, no rash.  He has multiple tattoos.  MUSCULOSKELETAL:  Reveals no joint deformity or effusions.  No spine  tenderness.  NEUROLOGIC:  He is awake, alert, orient x3.  He has no tremulousness.  Strength is 5/5 in all extremities.   Chest x-ray reveals mild changes of COPD, nothing acute.  Electrocardiogram reveals a rate of 72, sinus rhythm with no ischemic  changes.   LABORATORIES:  Hemoglobin 14.3, sodium 140, potassium 3.3, BUN 12,  creatinine 0.8.  CK-MB less than 1.  Troponin less than 0.05 on point of  care testing.  INR is 0.9.   IMPRESSION:  A 60 year old  white man with multiple risk factors and  three days of chest tightness, fatigue, and shortness of breath.   PLAN:  1. Check d-dimer.  2. Rule out myocardial infarction.  Admit to telemetry.  Continue      aspirin, Lovenox, and nitroglycerin.  Initiate Lopressor and      empiric statin therapy.  Will check a fasting lipid panel.  Will      keep the patient n.p.o. after midnight for catheterization versus      stress test in the morning.  3. Will emphasize tobacco cessation and arrange counseling and      consider Chantix.  4. Check a urine toxicology screen.  5. Watch closely for alcohol and benzodiazepine withdrawal.  Will use      liberal Ativan as needed and will schedule this if necessary.  6. Continue proton pump inhibitor with history of ulcer disease.  Will      also check Hemoccult stools.  7. If patient rules in for myocardial infarction, will consult      interventional cardiologist for possible catheterization overnight.      Cody Faith, MD  Electronically Signed     [name]/MEDQ  D:  05/20/2006  T:  05/21/2006  Job:  [id number]

## 2010-06-16 NOTE — Op Note (Signed)
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   SURGICAL PROCEDURE:  Emergency coronary artery bypass grafting x4 with  the left internal mammary sequentially to the second diagonal and distal  left anterior descending artery, reverse saphenous vein graft to the  first obtuse marginal, reverse saphenous vein graft to the posterior  descending with right thigh and calf endovein harvesting.   SURGEON:  Sheliah Plane, M.D.   FIRST ASSISTANT:  Constance Holster, P.A.   BRIEF HISTORY:  Patient is a 60 year old male whose history is outlined  in the consult note, presented with unstable anginal symptoms and  underwent a cardiac catheterization this morning.  Found to have very  critical anatomy with greater than 95% obstruction of his left main and  ruptured plaque in his LAD.  Emergency coronary artery bypass grafting  was recommended.  Patient was taken directly from the cath lab to the  OR.   DESCRIPTION OF PROCEDURE:  With Swan-Ganz and arterial line monitors in  place, patient underwent general endotracheal anesthesia without  incident.  The skin of the chest and lungs was prepped with Betadine and  draped in the usual sterile manner.  Using the Guidant endovein  harvesting system, a vein was harvested from the right thigh and calf.  A median sternotomy was performed.  The  left internal mammary artery was  dissected down as a pedicle graft.  The distal artery was divided.  Had  good free flow.  The pericardium was opened.  Overall ventricular  function appeared preserved.  The patient was systemically heparinized.  The ascending aorta and the right atrium were cannulated.  An aortic  root bent cardioplegia needle was introduced into the ascending aorta.  The heart was elevated.  The circumflex coronary artery was identified  and opened and admitted a 1.5 mm probe.  Using a running 7-0 Prolene,  distal anastomosis was performed.  Attention was then turned to the  posterior descending coronary artery, which was a poor quality vessel.  Did admit a 1 mm probe distally but had significant posterior wall  plaque throughout the vessel.  Using a running 7-0 Prolene, distal  anastomosis was performed.  Attention was then turned to the second  diagonal on the LAD.  The mammary artery was opened in the distal third  of the vessel a short distance, and longitudinal side-to-side  anastomosis was carried out with the second diagonal coronary artery.  There was the distal extent of the mammary artery that was then trimmed  to the appropriate length and anastomosed end-to-side between the mid  and distal third of the left anterior descending coronary artery.  The  vessel was approximately 1.3 mm in size.  The anastomosis was completed.  The bulldog was briefly removed from the mammary artery when the  anastomosis checked for bleeding.  There was a rise in myocardial septal  temperature.  The bulldog was placed back on the mammary artery.  Additional cold blood cardioplegia was administered with the aortic  cross clamp still in place.  Two punch aortotomies were performed.  Each  of the two vein grafts were anastomosed to the ascending aorta.  Air was  evacuated from the ascending aorta and grafts, and the aortic cross  clamp was removed with a total cross clamp time of 83  minutes.  Prior to  removing the cross clamp, the bulldog on the mammary artery was removed.  There was a rise in myocardial septal temperature.  Patient required  electrical defibrillation to return to a sinus rhythm.  Sites of  anastomosis were inspected and free of bleeding.  Atrial and ventricular  pacing wires were applied.  Patient was then ventilated and weaned from  cardiopulmonary bypass without difficulty.  He remained hemodynamically  stable.  He was decannulated in the usual fashion.  Protamine sulfate  was administered.  The operative field hemostatic.  The pericardium was  loosely reapproximated.  The left pleural tube and mediastinal tube were  left in place.  The sternum was closed with a #6 stainless steel wire.  The fascia was closed with interrupted 0 Vicryl, running 3-0 Vicryl in  the subcutaneous tissue, 4-0 subcuticular stitch in the skin edges.  Dry  dressings were applied.  Sponge and needle count was reported as correct  at the completion of the procedure.  Patient tolerated the procedure  without obvious complication and was transferred to the surgical  intensive care unit for further postoperative care.      Sheliah Plane, MD  Electronically Signed     EG/MEDQ  D:  05/22/2006  T:  05/22/2006  Job:  [id number]   cc:   [name]. [name], MD, [facility]
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
## 2010-06-16 NOTE — Discharge Summary (Signed)
[name], [name] NO.:  [id number]   MEDICAL RECORD NO.:  [id number]          PATIENT TYPE:  INP   LOCATION:  2041                         FACILITY:  [facility]   PHYSICIAN:  Sheliah Plane, MD    DATE OF BIRTH:  13-Apr-1950   DATE OF ADMISSION:  05/20/2006  DATE OF DISCHARGE:  05/27/2006                               DISCHARGE SUMMARY   PRIMARY ADMITTING DIAGNOSIS:  Chest pain.   ADDITIONAL/DISCHARGE DIAGNOSES:  1. Critical left main disease with ruptured proximal left anterior      descending plaque.  2. History of peptic ulcer disease.  3. History of polysubstance abuse.  4. Postoperative acute blood loss anemia.   PROCEDURES PERFORMED:  1. Cardiac catheterization.  2. Emergency coronary artery bypass grafting x4 (left internal mammary      artery sequentially to the second diagonal and the distal LAD,      saphenous vein graft to the first obtuse marginal, saphenous vein      graft to posterior descending).  3. Endoscopic vein harvest right leg.   HISTORY:  The patient is a 60 year old male with no previous cardiac  history.  He presented with a 4-to 5-day history of intermittent chest  discomfort and shortness of breath which initially started while he was  working on tearing down an old house.  The initial episode lasted 10-15  minutes and was associated with dizziness, diaphoresis and presyncope  and resolved without treatment.  Since that time he has had intermittent  episodes and presented to the hospital for further evaluation on May 20, 2006.  His troponin was 0.05 and CK-MBs were not elevated.  He was  subsequently admitted by cardiology for further workup.   HOSPITAL COURSE:  Following his admission, the patient underwent an  echocardiogram and an adenosine stress test which showed no evidence of  ischemia though the nuclear study showed a decreased ejection fraction  to 37% which correlated with his echo findings.  He underwent a cardiac  catheterization on May 22, 2006, by Dr. [name] and was found to have  greater than 95% ostial left main stenosis of proximal LAD ruptured  plaque and disease in the right coronary system.  Because of the  critical nature of his LAD and left main stenoses, cardiothoracic  surgery consultation was obtained while the patient was in the cath lab  for emergency bypass surgery.  Dr. Tyrone Sage saw the patient and reviewed  his films and agreed that he should be taken to the operating room  immediately and undergo emergency CABG.  He explained the risks,  benefits and alternatives to the patient and the patient agreed to  proceed with surgery.  He was taken to the operating room where he  underwent emergency CABG x4 as described in detail above.  He tolerated  the procedure well and was transferred to the SICU in stable condition.  He was able to be extubated shortly after surgery.  He was  hemodynamically stable and doing well on postop day #1.  Over the course  of his first postoperative day, he was weaned off  all drips, his chest  tubes were removed and his hemodynamic monitoring devices were also  discontinued.  By postop day #2, he was ready for transfer to the floor.  His only postoperative difficulty has been an acute blood loss anemia.  He has been started on oral iron and appears to be tolerating this well  with no symptoms.  His blood pressures have remained stable in the 120s  and 130s.  He has been started on a low-dose beta blocker and ACE  inhibitor and a statin.  He has been diuresed back down to within 5 kg  of his preoperative weight.  His incisions are all healing well.  He is  ambulating in the halls without difficulty.  His vital signs have been  stable and he is afebrile.  He is tolerating a regular diet and is  having normal bowel and bladder function.  His labs on postop day #4  show a hemoglobin of 7.3, hematocrit 21.6, white count 9.1, platelets  234.  BMET from postop  day #3 shows a sodium of 137, potassium 4.1, BUN  7, creatinine 0.79.  It is felt that if he continues to remain stable  over the next 24 hours he will hopefully be ready for discharge home  pending morning round evaluation and a repeat CBC and BMET on May 27, 2006.   DISCHARGE MEDICATIONS WILL BE AS FOLLOWS:  1. Aspirin 325 mg daily.  2. Toprol XL 25 mg daily.  3. Altace 2.5 mg daily.  4. Lipitor 20 mg daily.  5. Ferrous sulfate 325 mg b.i.d.  6. Folic acid 1 mg daily.  7. Tylox one to two q.4 h p.r.n. for pain.   DISCHARGE INSTRUCTIONS:  He is asked to refrain from driving, heavy  lifting or strenuous activity.  He may continue ambulating daily and  using his incentive spirometer.  He may shower daily and clean his  incisions with soap and water.  He will continue his same preoperative  diet.   DISCHARGE FOLLOWUP:  He will need to make an appointment to see Dr. Myrtis Ser  in 2 weeks.  He will have a chest x-ray at that visit.  He will then  follow up with Dr. Tyrone Sage in 3 weeks and our office will contact him  with an appointment.  If he has any problems or questions in the  interim, he is asked to contact our office immediately.      Coral Ceo, P.A.      Sheliah Plane, MD  Electronically Signed    GC/MEDQ  D:  05/26/2006  T:  05/26/2006  Job:  [id number]   cc:   Luis Abed, MD, [facility]  Christell Faith, MD

## 2010-06-22 ENCOUNTER — Other Ambulatory Visit: Payer: Self-pay | Admitting: Cardiovascular Disease

## 2010-06-22 DIAGNOSIS — I6529 Occlusion and stenosis of unspecified carotid artery: Secondary | ICD-10-CM

## 2010-06-23 ENCOUNTER — Encounter (INDEPENDENT_AMBULATORY_CARE_PROVIDER_SITE_OTHER): Payer: Medicaid Other | Admitting: *Deleted

## 2010-06-23 ENCOUNTER — Other Ambulatory Visit: Payer: Self-pay | Admitting: *Deleted

## 2010-06-23 DIAGNOSIS — I6529 Occlusion and stenosis of unspecified carotid artery: Secondary | ICD-10-CM

## 2010-06-28 ENCOUNTER — Encounter: Payer: Self-pay | Admitting: Cardiovascular Disease

## 2010-06-29 ENCOUNTER — Encounter: Payer: Self-pay | Admitting: Cardiovascular Disease

## 2010-07-10 ENCOUNTER — Other Ambulatory Visit: Payer: Self-pay | Admitting: Cardiovascular Disease

## 2010-07-11 ENCOUNTER — Ambulatory Visit (INDEPENDENT_AMBULATORY_CARE_PROVIDER_SITE_OTHER): Payer: Medicaid Other | Admitting: Cardiovascular Disease

## 2010-07-11 ENCOUNTER — Encounter: Payer: Self-pay | Admitting: Cardiovascular Disease

## 2010-07-11 DIAGNOSIS — F17200 Nicotine dependence, unspecified, uncomplicated: Secondary | ICD-10-CM

## 2010-07-11 DIAGNOSIS — I2510 Atherosclerotic heart disease of native coronary artery without angina pectoris: Secondary | ICD-10-CM

## 2010-07-11 DIAGNOSIS — I739 Peripheral vascular disease, unspecified: Secondary | ICD-10-CM

## 2010-07-11 DIAGNOSIS — E7800 Pure hypercholesterolemia, unspecified: Secondary | ICD-10-CM

## 2010-07-11 NOTE — Assessment & Plan Note (Signed)
Using ecig now with only 1-2 cigaretts /day.  Improved

## 2010-07-11 NOTE — Progress Notes (Signed)
[name] is seen today for F/U of smoking, PVD with previous right ilac stent. CAD with prevous CABG and known occluded SVG to RCA. He is not having any SSCP or claudication. He is smoking about a ppd. I counseled him for less than 10 minutes regarding cessation. He said Chantix and Welbutrin made him worse. We discussed as previously the use of nicotine replacement including gum and patches but he seems poorly motivated. He has a significant anxiety disorder and has had issues with Xanax. He lost his primary care doctor and has floated around last seeing Dr Janna Arch. He tried to substitue ctalopram for xanax and Gar doesnt' like it. He has some anger issues and actually admits to buying Xanax on the street. We discussed a referral to behavioral health and I think this would be helpful Since I last saw him he had back surgery with Dr Shon Baton with limited success He still has a lot of back pain and is due to get an injection next week. Told him to stop ASA 5 days before   ROS: Denies fever, malais, weight loss, blurry vision, decreased visual acuity, cough, sputum, SOB, hemoptysis, pleuritic pain, palpitaitons, heartburn, abdominal pain, melena, lower extremity edema, claudication, or rash.  All other systems reviewed and negative  General: Affect appropriate Healthy:  appears stated age HEENT: normal Neck supple with no adenopathy JVP normal no bruits no thyromegaly Lungs clear with no wheezing and good diaphragmatic motion Heart:  S1/S2 no murmur,rub, gallop or click PMI normal Abdomen: benighn, BS positve, no tenderness, no AAA no bruit.  No HSM or HJR Distal pulses intact with  Right femoral bruits No edema Neuro non-focal Skin warm and dry No muscular weakness   Current Outpatient Prescriptions  Medication Sig Dispense Refill  . aspirin 81 MG tablet Take 81 mg by mouth daily.        . hydrocodone-acetaminophen (LORCET-HD) 5-500 MG per capsule Take 1 capsule by mouth every 6 (six)  hours as needed.        . metoprolol tartrate (LOPRESSOR) 25 MG tablet Take 25 mg by mouth 2 (two) times daily.        . nitroGLYCERIN (NITROSTAT) 0.4 MG SL tablet Place 0.4 mg under the tongue every 5 (five) minutes as needed.        . OMEGA 3 1000 MG CAPS Take by mouth 3 (three) times daily.        Marland Kitchen PLAVIX 75 MG tablet TAKE ONE TABLET BY MOUTH EVERY DAY  30 each  12  . Prenatal MV-Min-Fe Fum-FA-DHA (PRENATAL 1 PO) Take by mouth.        . simvastatin (ZOCOR) 40 MG tablet Take 1 tablet (40 mg total) by mouth every evening.  30 tablet  11  . DISCONTD: folic acid (FOLVITE) 1 MG tablet Take 1 mg by mouth daily.        Marland Kitchen DISCONTD: HYDROcodone-acetaminophen (LORTAB) 7.5-500 MG per tablet Take 1 tablet by mouth every 6 (six) hours as needed.       Marland Kitchen DISCONTD: traZODone (DESYREL) 50 MG tablet Take 50 mg by mouth as needed.          Allergies  Review of patient's allergies indicates no known allergies.  Electrocardiogram:  NSR 61 normal ECG  Assessment and Plan

## 2010-07-11 NOTE — Assessment & Plan Note (Signed)
Cholesterol is at goal.  Continue current dose of statin and diet Rx.  No myalgias or side effects.  F/U  LFT's in 6 months. No results found for this basename: LDLCALC             

## 2010-07-11 NOTE — Patient Instructions (Signed)
Your physician recommends that you schedule a follow-up appointment in: 6 months with Dr. [name]  Your physician has requested that you have a lower extremity arterial duplex. This test is an ultrasound of the arteries in the legs or arms. It looks at arterial blood flow in the legs and arms. Allow one hour for Lower and Upper Arterial scans. There are no restrictions or special instructions

## 2010-07-11 NOTE — Assessment & Plan Note (Signed)
Some pain in legs with ambulation.  With smoking will check LE arterial duplex and ABI's

## 2010-07-11 NOTE — Assessment & Plan Note (Signed)
Stable no angina.  Ok to have back stimulator with Dr Ethelene Hal

## 2010-07-17 ENCOUNTER — Encounter (INDEPENDENT_AMBULATORY_CARE_PROVIDER_SITE_OTHER): Payer: Medicaid Other | Admitting: *Deleted

## 2010-07-17 ENCOUNTER — Encounter: Payer: Self-pay | Admitting: Cardiovascular Disease

## 2010-07-17 DIAGNOSIS — I739 Peripheral vascular disease, unspecified: Secondary | ICD-10-CM
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
## 2010-09-19 IMAGING — RF DG LUMBAR SPINE 2-3V
1 series · 4 of 4 positions shown · non-contrast
Comparison: Transforaminal lumbar interbody fusion

CLINICAL DATA: Degenerative disc disease

LUMBAR SPINE - 2-3 VIEW

[Series 1: run · 4 of 4 slices shown]
[im 1/4]
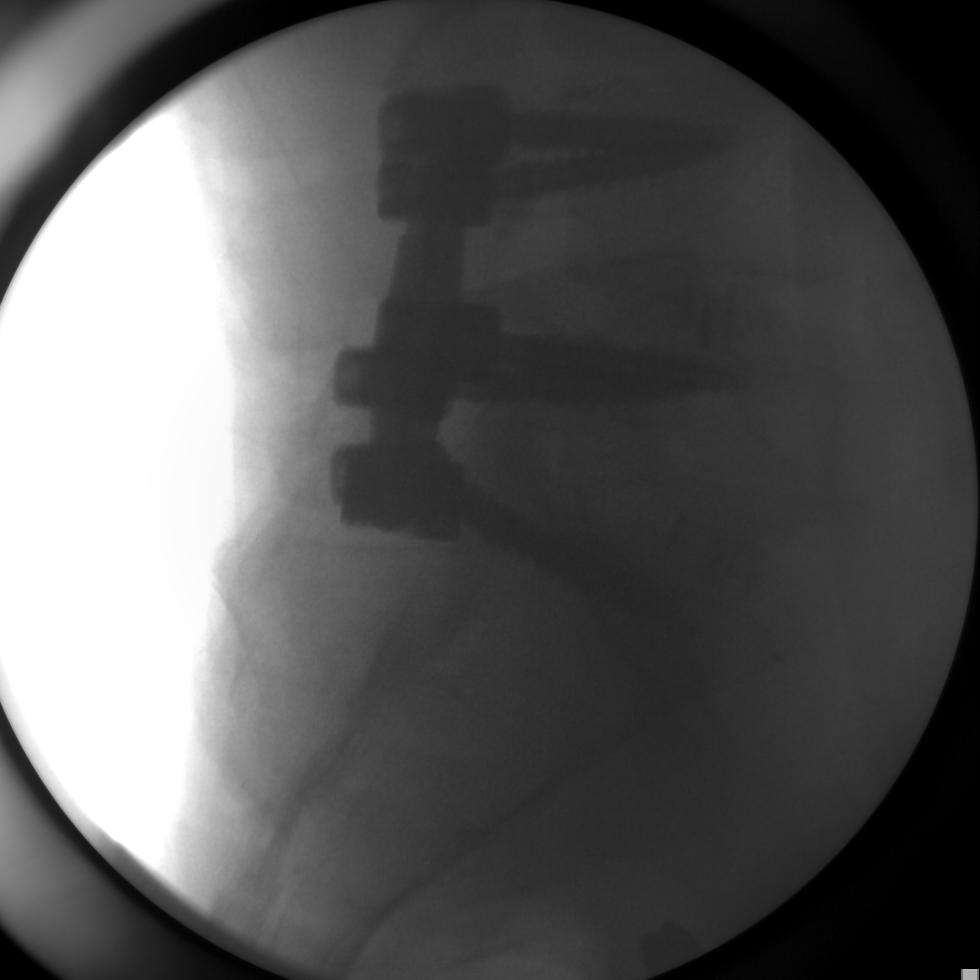
[im 2/4]
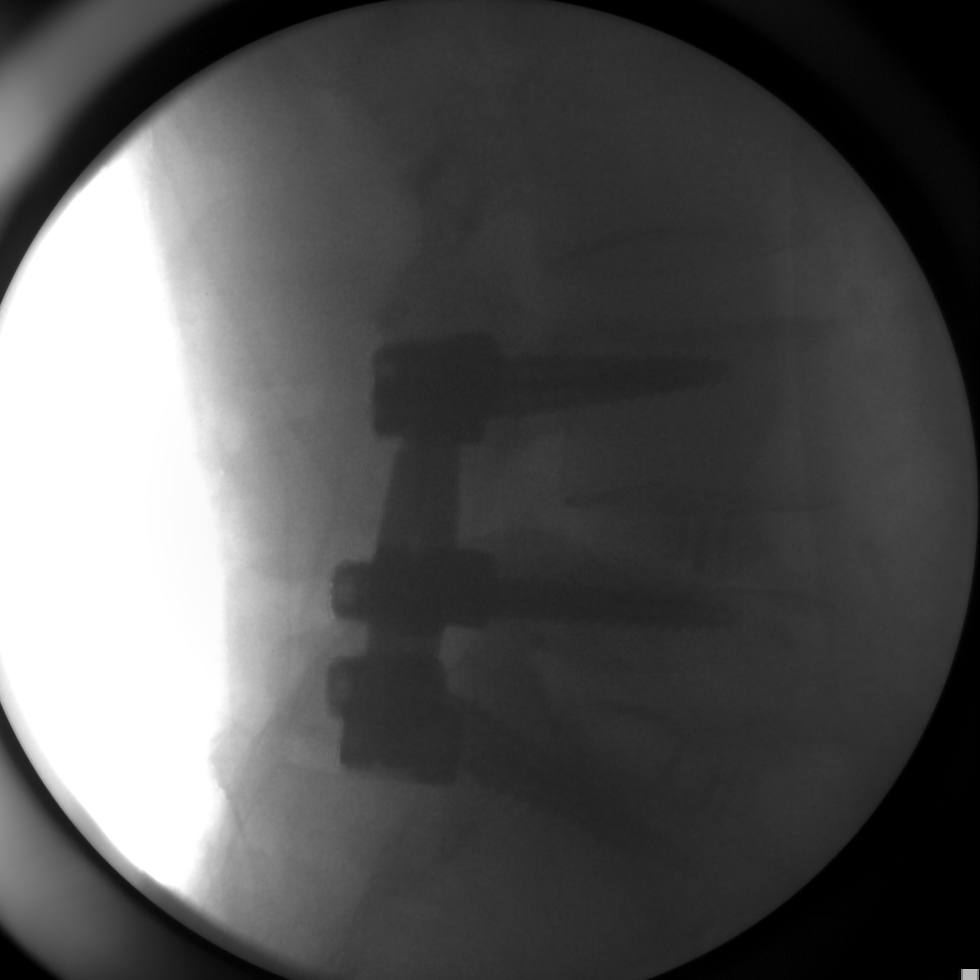
[im 3/4]
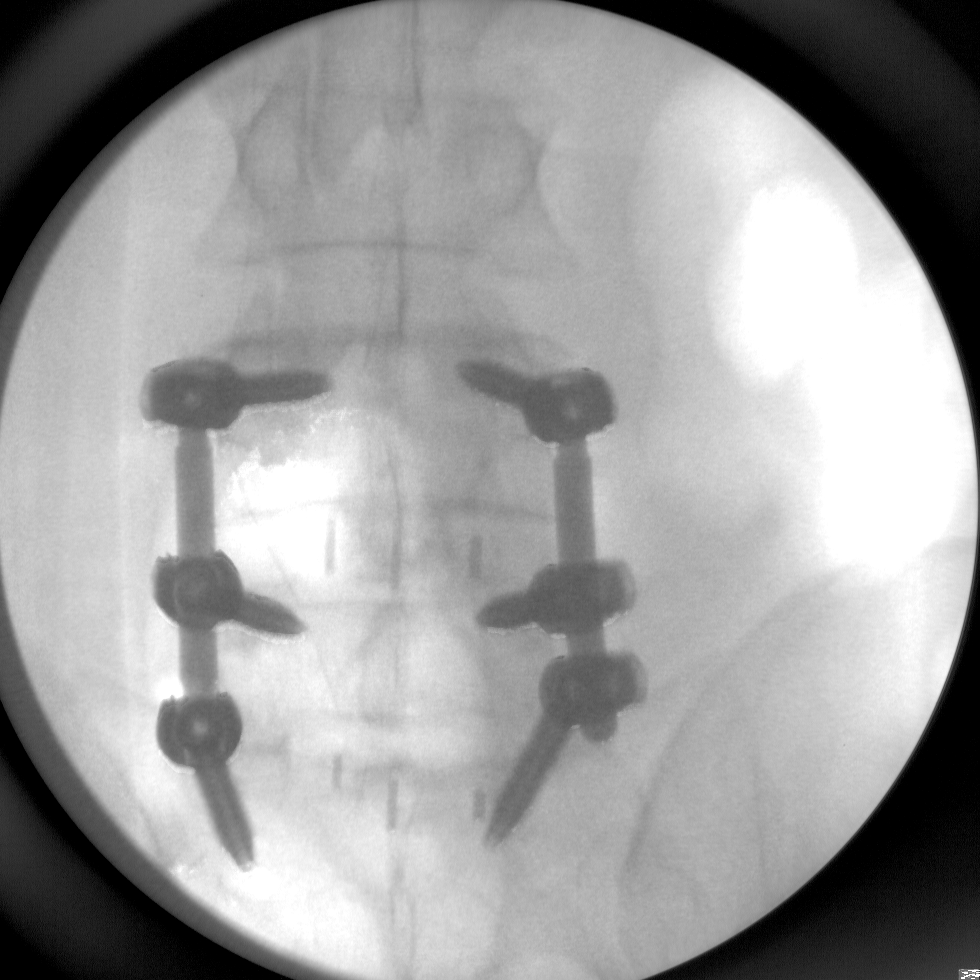
[im 4/4]
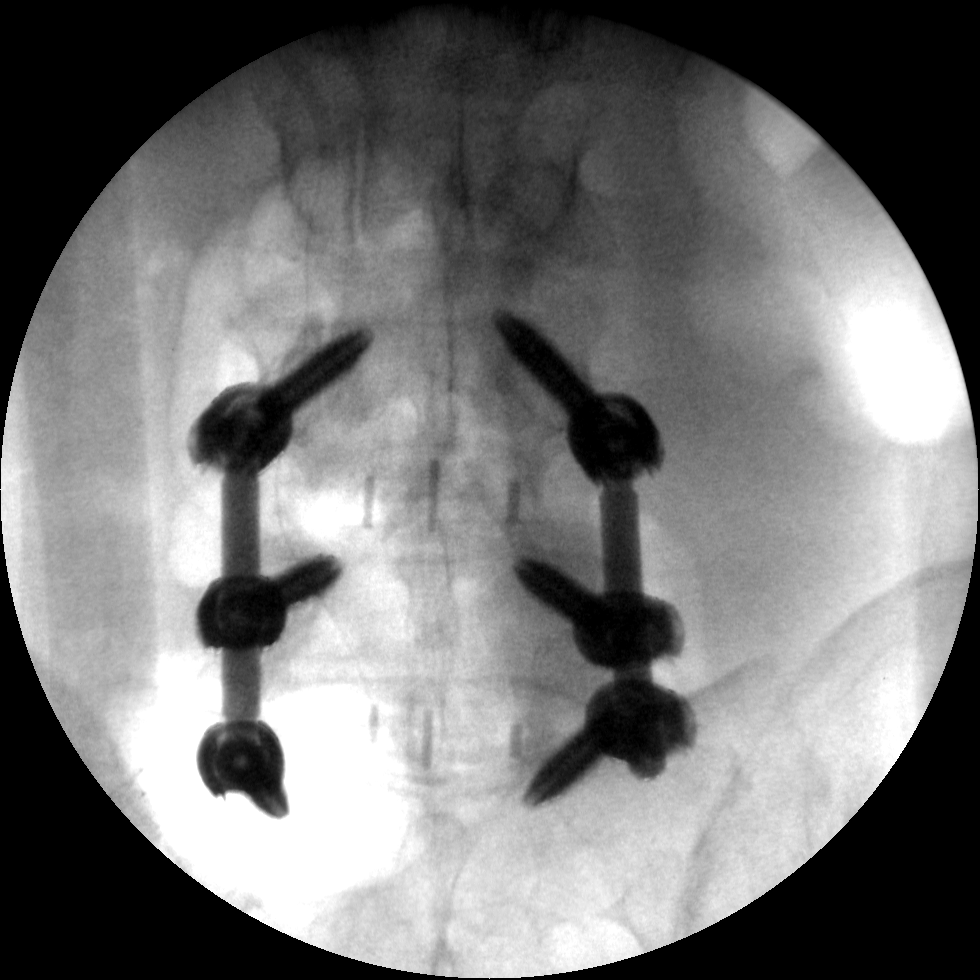

[4 of 4 positions shown; findings below may reference images not displayed]

FINDINGS: Four intraoperative spot fluoro images are submitted
after the procedure.  Using the same numbering scheme as on the
plain film exam from 12/06/2008, the the patient is status post
discectomy and interbody fusion at L4-5 and L5-S1 bilateral pedicle
screws are seen in L4, L5, and S1.  No evidence for immediate
hardware complications.
IMPRESSION: Status post lower lumbar fusion.
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
## 2010-10-23 LAB — CBC
Hemoglobin: 12.5 — ABNORMAL LOW
MCHC: 33.4
MCV: 92.8
Platelets: 396
RBC: 4 — ABNORMAL LOW
RDW: 14

## 2010-10-23 LAB — I-STAT 8, (EC8 V) (CONVERTED LAB)
Bicarbonate: 21.1
Glucose, Bld: 81
Potassium: 3.2 — ABNORMAL LOW
TCO2: 22
pCO2, Ven: 35.8 — ABNORMAL LOW
pH, Ven: 7.379 — ABNORMAL HIGH

## 2010-10-23 LAB — BASIC METABOLIC PANEL
BUN: 6
Calcium: 9.1
Creatinine, Ser: 0.81
GFR calc Af Amer: >60
GFR calc non Af Amer: >60
GFR calc non Af Amer: >60
Potassium: 3.7
Sodium: 141

## 2010-10-23 LAB — LIPASE, BLOOD: Lipase: 16

## 2010-10-23 LAB — RAPID URINE DRUG SCREEN, HOSP PERFORMED
Amphetamines: NOT DETECTED
Barbiturates: NOT DETECTED
Benzodiazepines: POSITIVE — AB
Cocaine: NOT DETECTED
Opiates: NOT DETECTED

## 2010-10-23 LAB — AMYLASE: Amylase: 64

## 2010-10-23 LAB — PROTIME-INR
INR: 1
Prothrombin Time: 13.1
Prothrombin Time: 13.7

## 2010-10-23 LAB — POCT CARDIAC MARKERS
CKMB, poc: <1 — ABNORMAL LOW
CKMB, poc: <1 — ABNORMAL LOW
Troponin i, poc: <0.05

## 2010-10-23 LAB — TSH: TSH: 0.728

## 2010-10-23 LAB — CK TOTAL AND CKMB (NOT AT ARMC): Relative Index: 1.2

## 2010-10-23 LAB — DIFFERENTIAL
Basophils Absolute: 0.1
Basophils Relative: 1
Eosinophils Absolute: 0.3
Neutrophils Relative %: 43

## 2010-10-23 LAB — HEPATIC FUNCTION PANEL
Alkaline Phosphatase: 71
Bilirubin, Direct: <0.1
Total Bilirubin: 0.3

## 2010-10-23 LAB — CARDIAC PANEL(CRET KIN+CKTOT+MB+TROPI)
CK, MB: 1.1
Total CK: 116

## 2010-10-23 LAB — POCT I-STAT CREATININE: Operator id: 294341

## 2010-10-23 LAB — D-DIMER, QUANTITATIVE: D-Dimer, Quant: <0.22

## 2010-10-23 LAB — APTT: aPTT: 36

## 2010-10-27 LAB — CBC
HCT: 36.4 — ABNORMAL LOW
Hemoglobin: 12.1 — ABNORMAL LOW
MCV: 95.1
Platelets: 274
RDW: 15.3

## 2010-10-27 LAB — APTT: aPTT: 31

## 2010-11-06 ENCOUNTER — Telehealth: Payer: Self-pay | Admitting: Cardiovascular Disease

## 2010-11-06 DIAGNOSIS — I739 Peripheral vascular disease, unspecified: Secondary | ICD-10-CM

## 2010-11-06 NOTE — Telephone Encounter (Signed)
Pt calling schedule doppler on lower leg. Pt said he was told to come back every 6 months to check leg and neck for blood clots. There is not a order until 09/28/11. Please return pt call to discuss further.

## 2010-11-06 NOTE — Telephone Encounter (Signed)
Spoke with pt, he had received a letter to call to schedule. Orders placed and appt scheduled [name]

## 2011-01-01 ENCOUNTER — Encounter: Payer: Medicaid Other | Admitting: *Deleted

## 2011-01-01 ENCOUNTER — Encounter: Payer: Medicaid Other | Admitting: Cardiology

## 2011-01-01 ENCOUNTER — Encounter (INDEPENDENT_AMBULATORY_CARE_PROVIDER_SITE_OTHER): Payer: Medicaid Other | Admitting: *Deleted

## 2011-01-01 DIAGNOSIS — I739 Peripheral vascular disease, unspecified: Secondary | ICD-10-CM

## 2011-07-10 ENCOUNTER — Other Ambulatory Visit: Payer: Self-pay | Admitting: Cardiovascular Disease

## 2011-07-11 NOTE — Telephone Encounter (Signed)
Pt needs appointment then refill can be made Fax Received. Refill Completed. Rhyleigh Grassel Chowoe (R.M.A)   

## 2011-09-20 ENCOUNTER — Other Ambulatory Visit: Payer: Self-pay | Admitting: Cardiovascular Disease

## 2011-09-20 MED ORDER — CLOPIDOGREL BISULFATE 75 MG PO TABS: 75.0000 mg | ORAL_TABLET | Freq: Every day | ORAL | Status: DC

## 2011-09-20 MED ORDER — SIMVASTATIN 40 MG PO TABS: 40.0000 mg | ORAL_TABLET | Freq: Every day | ORAL | Status: DC

## 2011-11-22 ENCOUNTER — Other Ambulatory Visit: Payer: Self-pay | Admitting: Cardiovascular Disease

## 2011-12-10 ENCOUNTER — Emergency Department (HOSPITAL_COMMUNITY)
Admission: EM | Admit: 2011-12-10 | Discharge: 2011-12-10 | Disposition: A | Discharge status: Home / Self Care | Payer: Medicaid Other | Attending: Emergency Medicine | Admitting: Emergency Medicine

## 2011-12-10 ENCOUNTER — Encounter (HOSPITAL_COMMUNITY): Payer: Self-pay | Admitting: Emergency Medicine

## 2011-12-10 DIAGNOSIS — F17200 Nicotine dependence, unspecified, uncomplicated: Secondary | ICD-10-CM | POA: Insufficient documentation

## 2011-12-10 DIAGNOSIS — S139XXA Sprain of joints and ligaments of unspecified parts of neck, initial encounter: Secondary | ICD-10-CM | POA: Insufficient documentation

## 2011-12-10 DIAGNOSIS — I10 Essential (primary) hypertension: Secondary | ICD-10-CM | POA: Insufficient documentation

## 2011-12-10 DIAGNOSIS — S161XXA Strain of muscle, fascia and tendon at neck level, initial encounter: Secondary | ICD-10-CM

## 2011-12-10 DIAGNOSIS — I739 Peripheral vascular disease, unspecified: Secondary | ICD-10-CM | POA: Insufficient documentation

## 2011-12-10 DIAGNOSIS — Z79899 Other long term (current) drug therapy: Secondary | ICD-10-CM | POA: Insufficient documentation

## 2011-12-10 DIAGNOSIS — E7800 Pure hypercholesterolemia, unspecified: Secondary | ICD-10-CM | POA: Insufficient documentation

## 2011-12-10 DIAGNOSIS — Y939 Activity, unspecified: Secondary | ICD-10-CM | POA: Insufficient documentation

## 2011-12-10 DIAGNOSIS — M545 Low back pain, unspecified: Secondary | ICD-10-CM | POA: Insufficient documentation

## 2011-12-10 DIAGNOSIS — F411 Generalized anxiety disorder: Secondary | ICD-10-CM | POA: Insufficient documentation

## 2011-12-10 DIAGNOSIS — Y929 Unspecified place or not applicable: Secondary | ICD-10-CM | POA: Insufficient documentation

## 2011-12-10 DIAGNOSIS — Z7982 Long term (current) use of aspirin: Secondary | ICD-10-CM | POA: Insufficient documentation

## 2011-12-10 DIAGNOSIS — I2510 Atherosclerotic heart disease of native coronary artery without angina pectoris: Secondary | ICD-10-CM | POA: Insufficient documentation

## 2011-12-10 DIAGNOSIS — I252 Old myocardial infarction: Secondary | ICD-10-CM | POA: Insufficient documentation

## 2011-12-10 DIAGNOSIS — E785 Hyperlipidemia, unspecified: Secondary | ICD-10-CM | POA: Insufficient documentation

## 2011-12-10 DIAGNOSIS — X58XXXA Exposure to other specified factors, initial encounter: Secondary | ICD-10-CM | POA: Insufficient documentation

## 2011-12-10 MED ORDER — CYCLOBENZAPRINE HCL 10 MG PO TABS
10.0000 mg | ORAL_TABLET | Freq: Once | ORAL | Status: AC
  Administered 2011-12-10: 10 mg via ORAL
  Filled 2011-12-10: qty 1

## 2011-12-10 MED ORDER — CYCLOBENZAPRINE HCL 10 MG PO TABS: ORAL_TABLET | ORAL | Status: DC

## 2011-12-10 NOTE — ED Provider Notes (Signed)
Medical screening examination/treatment/procedure(s) were conducted as a shared visit with non-physician practitioner(s) and myself.  I personally evaluated the patient during the encounter.  No clinical evidence of meningitis.discharge home on Flexeril. No neuro deficits  Donnetta Hutching, MD 12/10/11 1345

## 2011-12-10 NOTE — ED Notes (Signed)
Pt states pain in neck/back and left arm since Friday. Hurts to move neck at all. Pt has history of back surgery. Denies injury. Denies SOB and denies CHEST PAIN.

## 2011-12-10 NOTE — ED Provider Notes (Signed)
History     CSN: [id number]  Arrival date & time 12/10/11  1041   First MD Initiated Contact with Patient 12/10/11 1101      Chief Complaint  Patient presents with  . Neck Pain  . Back Pain    (Consider location/radiation/quality/duration/timing/severity/associated sxs/prior treatment) HPI Comments: Having upper back/neck pain x 3 days.  No lifting, twisting or fall. Has MD in [place] who writes for regular rx's for hydrocodone.   Patient is a 61 y.o. male presenting with neck pain and back pain. The history is provided by the patient. No language interpreter was used.  Neck Pain  This is a new problem. Episode onset: 3 days ago. The problem occurs constantly. The problem has not changed since onset.The pain is associated with nothing. There has been no fever. The pain is present in the left side and right side. Quality: "stiff" The pain is moderate. The symptoms are aggravated by bending and twisting. He has tried analgesics for the symptoms.  Back Pain     Past Medical History  Diagnosis Date  . Myocardial infarction   . PVD (peripheral vascular disease)   . CAD (coronary artery disease)   . Hyperlipidemia   . Peptic ulcer, unspecified site, unspecified as acute or chronic, without mention of hemorrhage, perforation, or obstruction   . Anxiety   . Phlebitis and thrombophlebitis of unspecified site   . Other malaise and fatigue   . Pure hypercholesterolemia   . Back pain   . Hip pain   . HTN (hypertension)     Hx of it. Previous HTN is crrently well controlled  . Vision loss     transient  . Viral gastroenteritis     recent  . Plaque     ruptured plaque in his left main.     Past Surgical History  Procedure Date  . Coronary artery bypass graft     Gerhardt 04/2006. emergency CABG x4 with the left internal mammary sequentially to the second diagonal and distal left anterior descending artery, reverse saphenous vein graft to the first obtuse marginal, reverse saphenous  vein graft to the posterior desccending with the right thigh and calf endovein harvesting.  . Coronary angioplasty     04/21/07 RCA graft occluded non-flow limiting disease in native RCA Medical therapy.   Fortunato Curling 09/03/07 Abdominal aortography with runoff, right commom iliac angiography, and right external iliac stenting.   . Colectomy   . Brain operation   . Back surgery     Family History  Problem Relation Age of Onset  . Heart failure Mother     deceased at age 81  . Cancer Father     deceased  . Heart attack Sister     had MI in 80's     History  Substance Use Topics  . Smoking status: Current Every Day Smoker -- 1.0 packs/day  . Smokeless tobacco: Not on file     Comment: Has a longstanding tobacco history.   . Alcohol Use: Yes     Comment: History of heavy alcoholl abuse      Review of Systems  HENT: Positive for neck pain.   Musculoskeletal: Positive for back pain.  All other systems reviewed and are negative.    Allergies  Review of patient's allergies indicates no known allergies.  Home Medications   Current Outpatient Rx  Name  Route  Sig  Dispense  Refill  . ASPIRIN 81 MG PO TABS  Oral   Take 81 mg by mouth daily.           Marland Kitchen CLOPIDOGREL BISULFATE 75 MG PO TABS   Oral   Take 75 mg by mouth daily.         . OMEGA-3 FATTY ACIDS 1000 MG PO CAPS   Oral   Take 1 g by mouth 2 (two) times daily.         Marland Kitchen HYDROCODONE-ACETAMINOPHEN 5-500 MG PO TABS   Oral   Take 1 tablet by mouth every 6 (six) hours as needed. Pain         . METOPROLOL TARTRATE 25 MG PO TABS   Oral   Take 25 mg by mouth 2 (two) times daily.           Marland Kitchen NITROGLYCERIN 0.4 MG SL SUBL   Sublingual   Place 0.4 mg under the tongue every 5 (five) minutes as needed. Chest Pain         . PRENATAL 1 PO   Oral   Take 1 tablet by mouth daily.          Marland Kitchen SIMVASTATIN 40 MG PO TABS   Oral   Take 1 tablet (40 mg total) by mouth at bedtime.   30 tablet   0     Pt  needs appointment then refill can be made   . CYCLOBENZAPRINE HCL 10 MG PO TABS      1/2 to one tab po TID   20 tablet   0     BP 177/90  Pulse 60  Temp 97.4 F (36.3 C) (Oral)  Resp 20  Ht 5\' 8"  (1.727 m)  Wt 175 lb (79.379 kg)  BMI 26.61 kg/m2  SpO2 99%  Physical Exam  Nursing note and vitals reviewed. Constitutional: He is oriented to person, place, and time. He appears well-developed and well-nourished.  HENT:  Head: Normocephalic and atraumatic.  Eyes: EOM are normal.  Neck: No rigidity. Decreased range of motion present.    Cardiovascular: Normal rate, regular rhythm, normal heart sounds and intact distal pulses.   Pulmonary/Chest: Effort normal and breath sounds normal. No respiratory distress.  Abdominal: Soft. He exhibits no distension. There is no tenderness.  Neurological: He is alert and oriented to person, place, and time.  Skin: Skin is warm and dry.  Psychiatric: He has a normal mood and affect. Judgment normal.    ED Course  Procedures (including critical care time)  Labs Reviewed - No data to display No results found.   1. Cervical strain, acute       MDM  rx-flexeril 10 mg, 20 F/u with your PCP        Evalina Field, PA 12/10/11 1216

## 2012-02-15 ENCOUNTER — Ambulatory Visit: Payer: Medicaid Other | Admitting: Cardiology

## 2012-03-07 ENCOUNTER — Other Ambulatory Visit: Payer: Self-pay | Admitting: *Deleted

## 2012-03-07 ENCOUNTER — Encounter (INDEPENDENT_AMBULATORY_CARE_PROVIDER_SITE_OTHER): Payer: Medicaid Other

## 2012-03-07 DIAGNOSIS — I739 Peripheral vascular disease, unspecified: Secondary | ICD-10-CM

## 2012-03-07 MED ORDER — CLOPIDOGREL BISULFATE 75 MG PO TABS: 75.0000 mg | ORAL_TABLET | Freq: Every day | ORAL | Status: DC

## 2012-03-07 MED ORDER — METOPROLOL TARTRATE 25 MG PO TABS: 25.0000 mg | ORAL_TABLET | Freq: Two times a day (BID) | ORAL | Status: DC

## 2012-03-07 MED ORDER — NITROGLYCERIN 0.4 MG SL SUBL: 0.4000 mg | SUBLINGUAL_TABLET | SUBLINGUAL | Status: DC | PRN

## 2012-03-22 ENCOUNTER — Emergency Department (HOSPITAL_COMMUNITY): Payer: Medicaid Other

## 2012-03-22 ENCOUNTER — Encounter (HOSPITAL_COMMUNITY): Payer: Self-pay | Admitting: *Deleted

## 2012-03-22 ENCOUNTER — Emergency Department (HOSPITAL_COMMUNITY)
Admission: EM | Admit: 2012-03-22 | Discharge: 2012-03-23 | Disposition: A | Discharge status: Home / Self Care | Payer: Medicaid Other | Attending: Emergency Medicine | Admitting: Emergency Medicine

## 2012-03-22 DIAGNOSIS — F411 Generalized anxiety disorder: Secondary | ICD-10-CM | POA: Insufficient documentation

## 2012-03-22 DIAGNOSIS — Z8669 Personal history of other diseases of the nervous system and sense organs: Secondary | ICD-10-CM | POA: Insufficient documentation

## 2012-03-22 DIAGNOSIS — F17200 Nicotine dependence, unspecified, uncomplicated: Secondary | ICD-10-CM | POA: Insufficient documentation

## 2012-03-22 DIAGNOSIS — R0602 Shortness of breath: Secondary | ICD-10-CM | POA: Insufficient documentation

## 2012-03-22 DIAGNOSIS — Z23 Encounter for immunization: Secondary | ICD-10-CM | POA: Insufficient documentation

## 2012-03-22 DIAGNOSIS — Z8639 Personal history of other endocrine, nutritional and metabolic disease: Secondary | ICD-10-CM | POA: Insufficient documentation

## 2012-03-22 DIAGNOSIS — M545 Low back pain, unspecified: Secondary | ICD-10-CM | POA: Insufficient documentation

## 2012-03-22 DIAGNOSIS — Z8711 Personal history of peptic ulcer disease: Secondary | ICD-10-CM | POA: Insufficient documentation

## 2012-03-22 DIAGNOSIS — Z8672 Personal history of thrombophlebitis: Secondary | ICD-10-CM | POA: Insufficient documentation

## 2012-03-22 DIAGNOSIS — I252 Old myocardial infarction: Secondary | ICD-10-CM | POA: Insufficient documentation

## 2012-03-22 DIAGNOSIS — S6390XA Sprain of unspecified part of unspecified wrist and hand, initial encounter: Secondary | ICD-10-CM | POA: Insufficient documentation

## 2012-03-22 DIAGNOSIS — I739 Peripheral vascular disease, unspecified: Secondary | ICD-10-CM | POA: Insufficient documentation

## 2012-03-22 DIAGNOSIS — Z79899 Other long term (current) drug therapy: Secondary | ICD-10-CM | POA: Insufficient documentation

## 2012-03-22 DIAGNOSIS — S60229A Contusion of unspecified hand, initial encounter: Secondary | ICD-10-CM | POA: Insufficient documentation

## 2012-03-22 DIAGNOSIS — IMO0002 Reserved for concepts with insufficient information to code with codable children: Secondary | ICD-10-CM | POA: Insufficient documentation

## 2012-03-22 DIAGNOSIS — Y9389 Activity, other specified: Secondary | ICD-10-CM | POA: Insufficient documentation

## 2012-03-22 DIAGNOSIS — I10 Essential (primary) hypertension: Secondary | ICD-10-CM | POA: Insufficient documentation

## 2012-03-22 DIAGNOSIS — Z862 Personal history of diseases of the blood and blood-forming organs and certain disorders involving the immune mechanism: Secondary | ICD-10-CM | POA: Insufficient documentation

## 2012-03-22 DIAGNOSIS — Z8719 Personal history of other diseases of the digestive system: Secondary | ICD-10-CM | POA: Insufficient documentation

## 2012-03-22 DIAGNOSIS — Z951 Presence of aortocoronary bypass graft: Secondary | ICD-10-CM | POA: Insufficient documentation

## 2012-03-22 DIAGNOSIS — S0990XA Unspecified injury of head, initial encounter: Secondary | ICD-10-CM | POA: Insufficient documentation

## 2012-03-22 DIAGNOSIS — Y92410 Unspecified street and highway as the place of occurrence of the external cause: Secondary | ICD-10-CM | POA: Insufficient documentation

## 2012-03-22 DIAGNOSIS — Z7982 Long term (current) use of aspirin: Secondary | ICD-10-CM | POA: Insufficient documentation

## 2012-03-22 DIAGNOSIS — Z872 Personal history of diseases of the skin and subcutaneous tissue: Secondary | ICD-10-CM | POA: Insufficient documentation

## 2012-03-22 DIAGNOSIS — I2510 Atherosclerotic heart disease of native coronary artery without angina pectoris: Secondary | ICD-10-CM | POA: Insufficient documentation

## 2012-03-22 DIAGNOSIS — Z7902 Long term (current) use of antithrombotics/antiplatelets: Secondary | ICD-10-CM | POA: Insufficient documentation

## 2012-03-22 DIAGNOSIS — Z9861 Coronary angioplasty status: Secondary | ICD-10-CM | POA: Insufficient documentation

## 2012-03-22 LAB — CBC WITH DIFFERENTIAL/PLATELET
Eosinophils Absolute: 0.2 10*3/uL (ref 0.0–0.7)
Eosinophils Relative: 1 % (ref 0–5)
HCT: 38.4 % — ABNORMAL LOW (ref 39.0–52.0)
Lymphocytes Relative: 24 % (ref 12–46)
Lymphs Abs: 3 10*3/uL (ref 0.7–4.0)
MCH: 29.2 pg (ref 26.0–34.0)
MCV: 86.3 fL (ref 78.0–100.0)
Monocytes Absolute: 1 10*3/uL (ref 0.1–1.0)
Platelets: 315 10*3/uL (ref 150–400)
RBC: 4.45 MIL/uL (ref 4.22–5.81)
WBC: 12.7 10*3/uL — ABNORMAL HIGH (ref 4.0–10.5)

## 2012-03-22 LAB — ETHANOL: Alcohol, Ethyl (B): 181 mg/dL — ABNORMAL HIGH (ref 0–11)

## 2012-03-22 LAB — BASIC METABOLIC PANEL
CO2: 23 mEq/L (ref 19–32)
Calcium: 9.3 mg/dL (ref 8.4–10.5)
Chloride: 102 mEq/L (ref 96–112)
Creatinine, Ser: 0.82 mg/dL (ref 0.50–1.35)
Glucose, Bld: 89 mg/dL (ref 70–99)
Sodium: 139 mEq/L (ref 135–145)

## 2012-03-22 MED ORDER — MORPHINE SULFATE 4 MG/ML IJ SOLN
4.0000 mg | Freq: Once | INTRAMUSCULAR | Status: AC
  Administered 2012-03-22: 4 mg via INTRAVENOUS
  Filled 2012-03-22: qty 1

## 2012-03-22 MED ORDER — TETANUS-DIPHTH-ACELL PERTUSSIS 5-2.5-18.5 LF-MCG/0.5 IM SUSP
0.5000 mL | Freq: Once | INTRAMUSCULAR | Status: AC
  Administered 2012-03-22: 0.5 mL via INTRAMUSCULAR
  Filled 2012-03-22: qty 0.5
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
## 2012-03-23 NOTE — ED Notes (Signed)
Patient left ama. Stated he was going to look for his dog and was leaving to find him. Verbalized understanding that he was leaving ama and didn't care because his dog was important

## 2012-03-28 ENCOUNTER — Ambulatory Visit (INDEPENDENT_AMBULATORY_CARE_PROVIDER_SITE_OTHER): Payer: Medicaid Other | Admitting: Cardiovascular Disease

## 2012-03-28 ENCOUNTER — Ambulatory Visit: Payer: Medicaid Other | Admitting: Cardiovascular Disease

## 2012-03-28 ENCOUNTER — Encounter: Payer: Self-pay | Admitting: Cardiovascular Disease

## 2012-03-28 VITALS — BP 166/78 | HR 69 | Ht 69.0 in | Wt 177.8 lb

## 2012-03-28 DIAGNOSIS — I739 Peripheral vascular disease, unspecified: Secondary | ICD-10-CM

## 2012-03-28 DIAGNOSIS — E785 Hyperlipidemia, unspecified: Secondary | ICD-10-CM

## 2012-03-28 DIAGNOSIS — F17200 Nicotine dependence, unspecified, uncomplicated: Secondary | ICD-10-CM

## 2012-03-28 DIAGNOSIS — I2510 Atherosclerotic heart disease of native coronary artery without angina pectoris: Secondary | ICD-10-CM

## 2012-03-28 DIAGNOSIS — M549 Dorsalgia, unspecified: Secondary | ICD-10-CM

## 2012-03-28 NOTE — Patient Instructions (Addendum)
Your physician wants you to follow-up in: 6 months with Dr. [name]. You will receive a reminder letter in the mail two months in advance. If you don't receive a letter, please call our office to schedule the follow-up appointment.  Continue current medications as prescribed

## 2012-03-28 NOTE — Assessment & Plan Note (Signed)
No claudication Mobility limited back pain.  Discussed link of smoking to PVD
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
## 2012-03-28 NOTE — Assessment & Plan Note (Signed)
Counseled for less than 10 minutes No motivation to quit Issues with CAD/PVD discussed.

## 2012-03-28 NOTE — Assessment & Plan Note (Signed)
Since he wont take statin and last TC was 143 will not try to substitue F/U labs with primary

## 2012-03-28 NOTE — Assessment & Plan Note (Signed)
Stable with no angina and good activity level.  Continue medical Rx  

## 2012-04-30 ENCOUNTER — Other Ambulatory Visit (HOSPITAL_COMMUNITY): Payer: Self-pay | Admitting: Orthopedic Surgery

## 2012-04-30 DIAGNOSIS — M25561 Pain in right knee: Secondary | ICD-10-CM

## 2012-05-01 ENCOUNTER — Ambulatory Visit (HOSPITAL_COMMUNITY)
Admission: RE | Admit: 2012-05-01 | Discharge: 2012-05-01 | Disposition: A | Discharge status: Home / Self Care | Payer: Medicaid Other | Source: Ambulatory Visit | Attending: Orthopedic Surgery | Admitting: Orthopedic Surgery

## 2012-05-01 ENCOUNTER — Encounter (HOSPITAL_COMMUNITY): Payer: Self-pay

## 2012-05-01 ENCOUNTER — Other Ambulatory Visit (HOSPITAL_COMMUNITY): Payer: Self-pay | Admitting: Orthopedic Surgery

## 2012-05-01 DIAGNOSIS — M25569 Pain in unspecified knee: Secondary | ICD-10-CM | POA: Insufficient documentation

## 2012-05-01 DIAGNOSIS — M7120 Synovial cyst of popliteal space [Baker], unspecified knee: Secondary | ICD-10-CM | POA: Insufficient documentation

## 2012-05-01 DIAGNOSIS — M25561 Pain in right knee: Secondary | ICD-10-CM

## 2012-05-01 DIAGNOSIS — S83509A Sprain of unspecified cruciate ligament of unspecified knee, initial encounter: Secondary | ICD-10-CM | POA: Insufficient documentation

## 2012-05-01 DIAGNOSIS — X58XXXA Exposure to other specified factors, initial encounter: Secondary | ICD-10-CM | POA: Insufficient documentation

## 2012-06-25 ENCOUNTER — Other Ambulatory Visit: Payer: Self-pay | Admitting: *Deleted

## 2012-06-25 MED ORDER — CLOPIDOGREL BISULFATE 75 MG PO TABS: 75.0000 mg | ORAL_TABLET | Freq: Every day | ORAL | Status: DC

## 2012-06-30 ENCOUNTER — Ambulatory Visit: Payer: Medicaid Other | Attending: Orthopedic Surgery | Admitting: Physical Therapy

## 2012-06-30 DIAGNOSIS — R5381 Other malaise: Secondary | ICD-10-CM | POA: Insufficient documentation

## 2012-06-30 DIAGNOSIS — IMO0001 Reserved for inherently not codable concepts without codable children: Secondary | ICD-10-CM | POA: Insufficient documentation

## 2012-06-30 DIAGNOSIS — M25569 Pain in unspecified knee: Secondary | ICD-10-CM | POA: Insufficient documentation

## 2012-07-04 ENCOUNTER — Ambulatory Visit: Payer: Medicaid Other | Admitting: *Deleted

## 2012-07-09 ENCOUNTER — Ambulatory Visit: Payer: Medicaid Other | Admitting: Physical Therapy

## 2012-07-14 ENCOUNTER — Encounter: Payer: Medicaid Other | Admitting: Physical Therapy

## 2012-07-16 ENCOUNTER — Encounter: Payer: Medicaid Other | Admitting: Physical Therapy

## 2012-07-23 ENCOUNTER — Other Ambulatory Visit: Payer: Self-pay | Admitting: Cardiovascular Disease

## 2012-09-25 ENCOUNTER — Encounter: Payer: Self-pay | Admitting: Cardiovascular Disease

## 2012-09-25 ENCOUNTER — Ambulatory Visit (INDEPENDENT_AMBULATORY_CARE_PROVIDER_SITE_OTHER): Payer: Medicaid Other | Admitting: Cardiovascular Disease

## 2012-09-25 VITALS — BP 154/84 | HR 68 | Wt 170.0 lb

## 2012-09-25 DIAGNOSIS — I6529 Occlusion and stenosis of unspecified carotid artery: Secondary | ICD-10-CM

## 2012-09-25 DIAGNOSIS — F17200 Nicotine dependence, unspecified, uncomplicated: Secondary | ICD-10-CM

## 2012-09-25 DIAGNOSIS — J449 Chronic obstructive pulmonary disease, unspecified: Secondary | ICD-10-CM

## 2012-09-25 MED ORDER — CLOPIDOGREL BISULFATE 75 MG PO TABS: 75.0000 mg | ORAL_TABLET | Freq: Every day | ORAL | Status: DC

## 2012-09-25 MED ORDER — METOPROLOL TARTRATE 25 MG PO TABS: ORAL_TABLET | ORAL | Status: DC

## 2012-09-25 NOTE — Assessment & Plan Note (Signed)
Counseled for less than 10 minutes no motivation to quit Chantix makes him want to smoke more
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
## 2012-09-25 NOTE — Assessment & Plan Note (Signed)
Needs fu duplex next visit with left ICA 40-59% disease

## 2012-09-25 NOTE — Assessment & Plan Note (Signed)
Stable Good ABI;s and palpable pulses no claudication

## 2012-09-25 NOTE — Patient Instructions (Addendum)
Your physician wants you to follow-up in:   6 MONTHS WITH DR Haywood Filler will receive a reminder letter in the mail two months in advance. If you don't receive a letter, please call our office to schedule the follow-up appointment. Your physician recommends that you continue on your current medications as directed. Please refer to the Current Medication list given to you today.  A chest x-ray takes a picture of the organs and structures inside the chest, including the heart, lungs, and blood vessels. This test can show several things, including, whether the heart is enlarges; whether fluid is building up in the lungs; and whether pacemaker / defibrillator leads are still in place. AT [place]  DUE TO  CAROTIDS  IN FEB HAVE DONE  AND SEE DR [name] SAME DAY

## 2012-09-25 NOTE — Assessment & Plan Note (Signed)
Cholesterol is at goal.  Continue current dose of statin and diet Rx.  No myalgias or side effects.  F/U  LFT's in 6 months. Lab Results  Component Value Date   [facility]  Value: 92        Total Cholesterol/HDL:CHD Risk Coronary Heart Disease Risk Table                     Men   Women  1/2 Average Risk   3.4   3.3 04/20/2007

## 2012-09-25 NOTE — Assessment & Plan Note (Signed)
Stable with no angina and good activity level.  Continue medical Rx Refill for Plavix called into [facility]

## 2012-10-10 ENCOUNTER — Ambulatory Visit (HOSPITAL_COMMUNITY)
Admission: RE | Admit: 2012-10-10 | Discharge: 2012-10-10 | Disposition: A | Discharge status: Home / Self Care | Payer: Medicaid Other | Source: Ambulatory Visit | Attending: Cardiovascular Disease | Admitting: Cardiovascular Disease

## 2012-10-10 DIAGNOSIS — I10 Essential (primary) hypertension: Secondary | ICD-10-CM | POA: Insufficient documentation

## 2012-10-10 DIAGNOSIS — J449 Chronic obstructive pulmonary disease, unspecified: Secondary | ICD-10-CM | POA: Insufficient documentation

## 2012-10-10 DIAGNOSIS — F17200 Nicotine dependence, unspecified, uncomplicated: Secondary | ICD-10-CM

## 2012-10-10 DIAGNOSIS — Z951 Presence of aortocoronary bypass graft: Secondary | ICD-10-CM | POA: Insufficient documentation

## 2012-10-10 DIAGNOSIS — R0602 Shortness of breath: Secondary | ICD-10-CM | POA: Insufficient documentation

## 2012-10-10 DIAGNOSIS — J4489 Other specified chronic obstructive pulmonary disease: Secondary | ICD-10-CM | POA: Insufficient documentation

## 2013-02-13 ENCOUNTER — Other Ambulatory Visit (HOSPITAL_COMMUNITY): Payer: Self-pay | Admitting: Cardiology

## 2013-02-13 DIAGNOSIS — I6529 Occlusion and stenosis of unspecified carotid artery: Secondary | ICD-10-CM

## 2013-03-24 ENCOUNTER — Encounter (HOSPITAL_COMMUNITY): Payer: Medicaid Other

## 2013-03-24 ENCOUNTER — Ambulatory Visit: Payer: Medicaid Other | Admitting: Cardiovascular Disease

## 2013-03-31 ENCOUNTER — Ambulatory Visit (HOSPITAL_COMMUNITY): Payer: Medicaid Other | Attending: Cardiology

## 2013-03-31 ENCOUNTER — Encounter: Payer: Self-pay | Admitting: Cardiology

## 2013-03-31 DIAGNOSIS — I6529 Occlusion and stenosis of unspecified carotid artery: Secondary | ICD-10-CM

## 2013-05-27 ENCOUNTER — Ambulatory Visit (INDEPENDENT_AMBULATORY_CARE_PROVIDER_SITE_OTHER): Payer: Medicaid Other | Admitting: Cardiovascular Disease

## 2013-05-27 ENCOUNTER — Encounter: Payer: Self-pay | Admitting: Cardiovascular Disease

## 2013-05-27 VITALS — BP 142/78 | HR 65 | Ht 68.0 in | Wt 171.8 lb

## 2013-05-27 DIAGNOSIS — F411 Generalized anxiety disorder: Secondary | ICD-10-CM

## 2013-05-27 DIAGNOSIS — M549 Dorsalgia, unspecified: Secondary | ICD-10-CM

## 2013-05-27 DIAGNOSIS — I2510 Atherosclerotic heart disease of native coronary artery without angina pectoris: Secondary | ICD-10-CM

## 2013-05-27 DIAGNOSIS — R29898 Other symptoms and signs involving the musculoskeletal system: Secondary | ICD-10-CM

## 2013-05-27 DIAGNOSIS — E7800 Pure hypercholesterolemia, unspecified: Secondary | ICD-10-CM

## 2013-05-27 NOTE — Assessment & Plan Note (Signed)
Left ICA 60-79% stenosis.  No TIA symptoms.  Continue antiplatelet Rx and F/U carotid duplex in 6 months   

## 2013-05-27 NOTE — Assessment & Plan Note (Signed)
Cholesterol is at goal.  Continue current dose of statin and diet Rx.  No myalgias or side effects.  F/U  LFT's in 6 months. Lab Results  Component Value Date   [facility]  Value: 92        Total Cholesterol/HDL:CHD Risk Coronary Heart Disease Risk Table                     Men   Women  1/2 Average Risk   3.4   3.3 04/20/2007   Labs with primary in [place]

## 2013-05-27 NOTE — Assessment & Plan Note (Signed)
Not clear if this is related to his leg weakness Called Dr Rolena Infante office ot facilitate f/u appt

## 2013-05-27 NOTE — Assessment & Plan Note (Signed)
Stable with no angina and good activity level.  Continue medical Rx  

## 2013-05-27 NOTE — Patient Instructions (Addendum)
Your physician wants you to follow-up in:   [place] will receive a reminder letter in the mail two months in advance. If you don't receive a letter, please call our office to schedule the follow-up appointment. Your physician recommends that you continue on your current medications as directed. Please refer to the Current Medication list given to you today. You have been referred to  DR  Berthold
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
## 2013-05-27 NOTE — Assessment & Plan Note (Signed)
Normal ABI;s good pulses on exam stable and not related to his leg weakness

## 2013-05-27 NOTE — Assessment & Plan Note (Signed)
F/U Behavioral health Got "hooked" on xanax in [place] Needs counseling for personality disorder

## 2013-06-26 ENCOUNTER — Other Ambulatory Visit: Payer: Self-pay | Admitting: *Deleted

## 2013-06-26 MED ORDER — OMEPRAZOLE 20 MG PO CPDR: 20.0000 mg | DELAYED_RELEASE_CAPSULE | Freq: Every day | ORAL | Status: DC

## 2013-08-05 ENCOUNTER — Other Ambulatory Visit: Payer: Self-pay

## 2013-08-05 ENCOUNTER — Telehealth: Payer: Self-pay

## 2013-08-05 MED ORDER — PRAVASTATIN SODIUM 20 MG PO TABS: 20.0000 mg | ORAL_TABLET | Freq: Every evening | ORAL | Status: DC

## 2013-08-05 NOTE — Telephone Encounter (Signed)
Ok to fill pravastatin but have her get fasting lipid and liver profile in the next week or two

## 2013-08-06 NOTE — Telephone Encounter (Signed)
LMTCB  RE NEEDING  FASTING LIPID LIVER  THIS  WEEK OR NEXT PER  DR NISHAN./CY

## 2013-08-10 ENCOUNTER — Other Ambulatory Visit: Payer: Self-pay | Admitting: *Deleted

## 2013-08-10 DIAGNOSIS — E785 Hyperlipidemia, unspecified: Secondary | ICD-10-CM

## 2013-08-10 DIAGNOSIS — Z79899 Other long term (current) drug therapy: Secondary | ICD-10-CM

## 2013-08-13 ENCOUNTER — Other Ambulatory Visit: Payer: Self-pay | Admitting: *Deleted

## 2013-08-13 ENCOUNTER — Other Ambulatory Visit (INDEPENDENT_AMBULATORY_CARE_PROVIDER_SITE_OTHER): Payer: Medicaid Other

## 2013-08-13 DIAGNOSIS — E785 Hyperlipidemia, unspecified: Secondary | ICD-10-CM

## 2013-08-13 DIAGNOSIS — Z79899 Other long term (current) drug therapy: Secondary | ICD-10-CM

## 2013-08-13 LAB — LIPID PANEL
Cholesterol: 165 mg/dL (ref 0–200)
HDL: 43.8 mg/dL (ref >39.00)
LDL Cholesterol: 109 mg/dL — ABNORMAL HIGH (ref 0–99)
NonHDL: 121.2
TRIGLYCERIDES: 63 mg/dL (ref 0.0–149.0)
Total CHOL/HDL Ratio: 4
VLDL: 12.6 mg/dL (ref 0.0–40.0)

## 2013-08-13 LAB — HEPATIC FUNCTION PANEL
ALBUMIN: 3.8 g/dL (ref 3.5–5.2)
ALK PHOS: 73 U/L (ref 39–117)
ALT: 16 U/L (ref 0–53)
AST: 19 U/L (ref 0–37)
Bilirubin, Direct: 0.1 mg/dL (ref 0.0–0.3)
TOTAL PROTEIN: 7.1 g/dL (ref 6.0–8.3)
Total Bilirubin: 0.6 mg/dL (ref 0.2–1.2)

## 2013-08-13 NOTE — Telephone Encounter (Signed)
PT  HERE   FOR  LABS TODAY  SEE APPTS./CY

## 2013-09-03 ENCOUNTER — Other Ambulatory Visit: Payer: Self-pay | Admitting: Orthopedic Surgery

## 2013-09-03 DIAGNOSIS — M79605 Pain in left leg: Principal | ICD-10-CM

## 2013-09-03 DIAGNOSIS — M48061 Spinal stenosis, lumbar region without neurogenic claudication: Secondary | ICD-10-CM

## 2013-09-03 DIAGNOSIS — M5126 Other intervertebral disc displacement, lumbar region: Secondary | ICD-10-CM

## 2013-09-03 DIAGNOSIS — M79604 Pain in right leg: Secondary | ICD-10-CM

## 2013-09-08 ENCOUNTER — Telehealth: Payer: Self-pay | Admitting: Cardiovascular Disease

## 2013-09-08 NOTE — Telephone Encounter (Signed)
Ok to have myelogram

## 2013-09-08 NOTE — Telephone Encounter (Signed)
WILL FORWARD TO DR NISHAN FOR  REVIEW./CY 

## 2013-09-08 NOTE — Telephone Encounter (Signed)
°  Patient needs a Myelogram done and needs surgical clearance. Clearance can be placed in EPIC. Any questions please call Andee Poles at [phone] @ [facility]

## 2013-09-11 NOTE — Telephone Encounter (Signed)
PER  DANIELLE  HAS  SEEN DR NISHAN'S  RESPONSE./CY

## 2013-09-11 NOTE — Telephone Encounter (Signed)
Lmtcb./cy

## 2013-09-22 ENCOUNTER — Ambulatory Visit
Admission: RE | Admit: 2013-09-22 | Discharge: 2013-09-22 | Disposition: A | Discharge status: Home / Self Care | Payer: Medicaid Other | Source: Ambulatory Visit | Attending: Orthopedic Surgery | Admitting: Orthopedic Surgery

## 2013-09-22 VITALS — BP 173/77 | HR 71

## 2013-09-22 DIAGNOSIS — M5126 Other intervertebral disc displacement, lumbar region: Secondary | ICD-10-CM

## 2013-09-22 DIAGNOSIS — M48061 Spinal stenosis, lumbar region without neurogenic claudication: Secondary | ICD-10-CM

## 2013-09-22 DIAGNOSIS — M79604 Pain in right leg: Secondary | ICD-10-CM

## 2013-09-22 DIAGNOSIS — M79605 Pain in left leg: Principal | ICD-10-CM

## 2013-09-22 MED ORDER — DIAZEPAM 5 MG PO TABS
10.0000 mg | ORAL_TABLET | Freq: Once | ORAL | Status: AC
  Administered 2013-09-22: 10 mg via ORAL

## 2013-09-22 MED ORDER — IOHEXOL 180 MG/ML  SOLN
15.0000 mL | Freq: Once | INTRAMUSCULAR | Status: AC | PRN
  Administered 2013-09-22: 15 mL via INTRATHECAL

## 2013-09-22 MED ORDER — MEPERIDINE HCL 100 MG/ML IJ SOLN
75.0000 mg | Freq: Once | INTRAMUSCULAR | Status: AC
  Administered 2013-09-22: 75 mg via INTRAMUSCULAR

## 2013-09-22 MED ORDER — ONDANSETRON HCL 4 MG/2ML IJ SOLN
4.0000 mg | Freq: Once | INTRAMUSCULAR | Status: AC
  Administered 2013-09-22: 4 mg via INTRAMUSCULAR

## 2013-09-22 NOTE — Discharge Instructions (Signed)

## 2013-09-22 NOTE — Progress Notes (Signed)
Pt states he has been off Plavix for about 10 days.  Discharge instructions explained to pt.

## 2013-10-01 ENCOUNTER — Other Ambulatory Visit: Payer: Self-pay | Admitting: *Deleted

## 2013-10-01 MED ORDER — METOPROLOL TARTRATE 25 MG PO TABS: ORAL_TABLET | ORAL | Status: DC

## 2013-10-01 MED ORDER — CLOPIDOGREL BISULFATE 75 MG PO TABS: 75.0000 mg | ORAL_TABLET | Freq: Every day | ORAL | Status: DC

## 2013-10-12 ENCOUNTER — Telehealth: Payer: Self-pay | Admitting: Cardiovascular Disease

## 2013-10-12 NOTE — Telephone Encounter (Signed)
Follow up:    Per pt his daughter fax the Surgical Clearance from to be filled out by Dr Johnsie Cancel and faxed back to [facility]. Dr Dahlia Bailiff # [phone]  Fax [phone]

## 2013-10-12 NOTE — Telephone Encounter (Signed)
Spoke with patient who states Dr. Rolena Infante is doing a revision of back surgery originally done in 2010.  Patient states he is seeing a new PCP in [facility], Dr. Massie Bougie. [place] who advised patient not to have surgery until his breathing is better. Patient states he has not smoked since Friday and has started using an inhaler.  Patient does not know surgeon's preference for stopping Plavix/ASA but remembers that he had to stop these with previous surgeries. I advised patient that I will send request to Dr. Johnsie Cancel and his primary nurse, Altha Harm, and that someone will call him back next week as they are both out of the office this week.  Patient verbalized understanding and agreement.

## 2013-10-12 NOTE — Telephone Encounter (Signed)
LMTCB to get more information about surgery.

## 2013-10-16 NOTE — Telephone Encounter (Signed)
Can stop plavix 4 days before surgery and resume when ok with surgeon

## 2013-10-16 NOTE — Telephone Encounter (Signed)
Informed patient of Dr. Kyla Balzarine recommendations.  Patient verbalized understanding and agreeable to plan.

## 2013-10-20 ENCOUNTER — Telehealth: Payer: Self-pay | Admitting: *Deleted

## 2013-10-20 NOTE — Telephone Encounter (Signed)
Previous CABG no chest pain stable Ok for repeat back surgery

## 2013-10-20 NOTE — Telephone Encounter (Signed)
PT  NEEDING  TO HAVE BACK SURGERY I N NEAR  FUTURE  TO  FIX  LOOSE  SCREWS   WILL FORWARD TO DR Johnsie Cancel FOR REVIEW./CY

## 2013-10-20 NOTE — Telephone Encounter (Signed)
UNABLE TO LEAVE MESSAGE  VOICE MAIL BOX  HAS  NOT BEEN SET UP  AS OF YET./CY

## 2013-10-23 NOTE — Telephone Encounter (Signed)
PT NOTIFIED ./CY PER PT  QUIT  SMOKING  AS  OF  10-09-13 ./CY

## 2013-11-17 ENCOUNTER — Other Ambulatory Visit: Payer: Self-pay

## 2013-11-17 MED ORDER — METOPROLOL TARTRATE 25 MG PO TABS: ORAL_TABLET | ORAL | Status: DC

## 2013-11-18 ENCOUNTER — Other Ambulatory Visit: Payer: Self-pay | Admitting: *Deleted

## 2013-11-18 MED ORDER — CLOPIDOGREL BISULFATE 75 MG PO TABS: 75.0000 mg | ORAL_TABLET | Freq: Every day | ORAL | Status: DC

## 2013-11-20 ENCOUNTER — Telehealth: Payer: Self-pay | Admitting: Cardiovascular Disease

## 2013-11-20 NOTE — Telephone Encounter (Signed)
New problem ° ° °Pt returning a call from nurse. Please call pt. °

## 2013-11-24 ENCOUNTER — Encounter: Payer: Self-pay | Admitting: Cardiovascular Disease

## 2013-11-24 ENCOUNTER — Ambulatory Visit (INDEPENDENT_AMBULATORY_CARE_PROVIDER_SITE_OTHER): Payer: Medicaid Other | Admitting: Cardiovascular Disease

## 2013-11-24 VITALS — BP 190/92 | HR 72 | Ht 69.0 in | Wt 165.8 lb

## 2013-11-24 DIAGNOSIS — R0989 Other specified symptoms and signs involving the circulatory and respiratory systems: Secondary | ICD-10-CM

## 2013-11-24 MED ORDER — AMLODIPINE BESYLATE 10 MG PO TABS: 10.0000 mg | ORAL_TABLET | Freq: Every day | ORAL | Status: DC

## 2013-11-24 NOTE — Assessment & Plan Note (Signed)
Stable with no angina and good activity level.  Continue medical Rx  

## 2013-11-24 NOTE — Assessment & Plan Note (Signed)
Left ICA 60-79% stenosis.  No TIA symptoms.  Holding antiplatelet Rx due to tarry stools  Duplex same day he sees me for f/u HTN

## 2013-11-24 NOTE — Telephone Encounter (Signed)
PT HAD APPT  TODAY  .  PT  IS  CURRENTLY HOLDING  ASA  AND PLAVIX  DUE TO BLEED./CY
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
## 2013-11-24 NOTE — Patient Instructions (Signed)
Your physician recommends that you schedule a follow-up appointment in: [place]   Your physician has recommended you make the following change in your medication:  INCREASE  AMLODIPINE  TO   10 MG  EVERY DAY   Your physician has requested that you have a carotid duplex. This test is an ultrasound of the carotid arteries in your neck. It looks at blood flow through these arteries that supply the brain with blood. Allow one hour for this exam. There are no restrictions or special instructions.

## 2013-11-24 NOTE — Assessment & Plan Note (Signed)
Has endo scheduled in 2 days  Continue omeprazole  Holding ASA and Plavix until source identified

## 2014-01-01 ENCOUNTER — Other Ambulatory Visit: Payer: Self-pay

## 2014-01-01 MED ORDER — METOPROLOL TARTRATE 25 MG PO TABS: ORAL_TABLET | ORAL | Status: DC
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
## 2014-01-04 ENCOUNTER — Ambulatory Visit (HOSPITAL_COMMUNITY): Payer: Medicaid Other | Attending: Cardiovascular Disease | Admitting: Cardiology

## 2014-01-04 ENCOUNTER — Encounter: Payer: Self-pay | Admitting: Cardiovascular Disease

## 2014-01-04 ENCOUNTER — Ambulatory Visit (INDEPENDENT_AMBULATORY_CARE_PROVIDER_SITE_OTHER): Payer: Medicaid Other | Admitting: Cardiovascular Disease

## 2014-01-04 VITALS — BP 162/90 | HR 63 | Ht 69.0 in | Wt 159.0 lb

## 2014-01-04 DIAGNOSIS — R0989 Other specified symptoms and signs involving the circulatory and respiratory systems: Secondary | ICD-10-CM

## 2014-01-04 DIAGNOSIS — I6523 Occlusion and stenosis of bilateral carotid arteries: Secondary | ICD-10-CM | POA: Diagnosis not present

## 2014-01-04 DIAGNOSIS — I2510 Atherosclerotic heart disease of native coronary artery without angina pectoris: Secondary | ICD-10-CM

## 2014-01-04 DIAGNOSIS — Z720 Tobacco use: Secondary | ICD-10-CM

## 2014-01-04 DIAGNOSIS — E7800 Pure hypercholesterolemia, unspecified: Secondary | ICD-10-CM

## 2014-01-04 DIAGNOSIS — I739 Peripheral vascular disease, unspecified: Secondary | ICD-10-CM

## 2014-01-04 DIAGNOSIS — I10 Essential (primary) hypertension: Secondary | ICD-10-CM

## 2014-01-04 DIAGNOSIS — F17200 Nicotine dependence, unspecified, uncomplicated: Secondary | ICD-10-CM

## 2014-01-04 NOTE — Assessment & Plan Note (Signed)
Counseled for less than 10 minutes  Link to recurrent vascular disease and MI discussed f/u primary

## 2014-01-04 NOTE — Assessment & Plan Note (Signed)
Stable with no angina and good activity level.  Continue medical Rx  

## 2014-01-04 NOTE — Patient Instructions (Signed)
Your physician wants you to follow-up in: YEAR WITH DR NISHAN  You will receive a reminder letter in the mail two months in advance. If you don't receive a letter, please call our office to schedule the follow-up appointment.  Your physician recommends that you continue on your current medications as directed. Please refer to the Current Medication list given to you today. 

## 2014-01-04 NOTE — Assessment & Plan Note (Signed)
Left ICA 60-79% stenosis.  No TIA symptoms.  Continue antiplatelet Rx and F/U carotid duplex in 6 months   

## 2014-01-04 NOTE — Assessment & Plan Note (Signed)
Normal ABI's 2014  No claudication previously described right femoral bruit not new

## 2014-01-04 NOTE — Progress Notes (Signed)
Carotid duplex performed 

## 2014-01-04 NOTE — Assessment & Plan Note (Signed)
Amlodipnine just increased by primary in [facility] last week He has f/u there 1/12  If still high add cozaar 50 mg

## 2014-01-04 NOTE — Assessment & Plan Note (Signed)
Cholesterol is at goal.  Continue current dose of statin and diet Rx.  No myalgias or side effects.  F/U  LFT's in 6 months. Lab Results  Component Value Date   LDLCALC 109* 08/13/2013

## 2014-01-12 ENCOUNTER — Other Ambulatory Visit: Payer: Self-pay | Admitting: Cardiovascular Disease

## 2014-01-13 ENCOUNTER — Other Ambulatory Visit: Payer: Self-pay | Admitting: Cardiovascular Disease

## 2014-01-28 ENCOUNTER — Emergency Department (HOSPITAL_COMMUNITY): Payer: Medicaid Other

## 2014-01-28 ENCOUNTER — Encounter (HOSPITAL_COMMUNITY): Payer: Self-pay | Admitting: *Deleted

## 2014-01-28 ENCOUNTER — Emergency Department (HOSPITAL_COMMUNITY)
Admission: EM | Admit: 2014-01-28 | Discharge: 2014-01-29 | Disposition: A | Discharge status: Home / Self Care | Payer: Medicaid Other | Attending: Emergency Medicine | Admitting: Emergency Medicine

## 2014-01-28 DIAGNOSIS — F10120 Alcohol abuse with intoxication, uncomplicated: Secondary | ICD-10-CM | POA: Diagnosis not present

## 2014-01-28 DIAGNOSIS — Z720 Tobacco use: Secondary | ICD-10-CM | POA: Diagnosis not present

## 2014-01-28 DIAGNOSIS — Y998 Other external cause status: Secondary | ICD-10-CM | POA: Insufficient documentation

## 2014-01-28 DIAGNOSIS — H547 Unspecified visual loss: Secondary | ICD-10-CM | POA: Insufficient documentation

## 2014-01-28 DIAGNOSIS — E785 Hyperlipidemia, unspecified: Secondary | ICD-10-CM | POA: Insufficient documentation

## 2014-01-28 DIAGNOSIS — S0101XA Laceration without foreign body of scalp, initial encounter: Secondary | ICD-10-CM | POA: Diagnosis present

## 2014-01-28 DIAGNOSIS — E780 Pure hypercholesterolemia: Secondary | ICD-10-CM | POA: Diagnosis not present

## 2014-01-28 DIAGNOSIS — I252 Old myocardial infarction: Secondary | ICD-10-CM | POA: Insufficient documentation

## 2014-01-28 DIAGNOSIS — Z7902 Long term (current) use of antithrombotics/antiplatelets: Secondary | ICD-10-CM | POA: Diagnosis not present

## 2014-01-28 DIAGNOSIS — I2510 Atherosclerotic heart disease of native coronary artery without angina pectoris: Secondary | ICD-10-CM | POA: Insufficient documentation

## 2014-01-28 DIAGNOSIS — Z8672 Personal history of thrombophlebitis: Secondary | ICD-10-CM | POA: Diagnosis not present

## 2014-01-28 DIAGNOSIS — Z8619 Personal history of other infectious and parasitic diseases: Secondary | ICD-10-CM | POA: Diagnosis not present

## 2014-01-28 DIAGNOSIS — Z951 Presence of aortocoronary bypass graft: Secondary | ICD-10-CM | POA: Insufficient documentation

## 2014-01-28 DIAGNOSIS — S199XXA Unspecified injury of neck, initial encounter: Secondary | ICD-10-CM | POA: Diagnosis not present

## 2014-01-28 DIAGNOSIS — Z79899 Other long term (current) drug therapy: Secondary | ICD-10-CM | POA: Insufficient documentation

## 2014-01-28 DIAGNOSIS — Z23 Encounter for immunization: Secondary | ICD-10-CM | POA: Diagnosis not present

## 2014-01-28 DIAGNOSIS — F419 Anxiety disorder, unspecified: Secondary | ICD-10-CM | POA: Insufficient documentation

## 2014-01-28 DIAGNOSIS — I10 Essential (primary) hypertension: Secondary | ICD-10-CM | POA: Diagnosis not present

## 2014-01-28 DIAGNOSIS — Z955 Presence of coronary angioplasty implant and graft: Secondary | ICD-10-CM | POA: Diagnosis not present

## 2014-01-28 DIAGNOSIS — W1839XA Other fall on same level, initial encounter: Secondary | ICD-10-CM | POA: Diagnosis not present

## 2014-01-28 DIAGNOSIS — Z8719 Personal history of other diseases of the digestive system: Secondary | ICD-10-CM | POA: Insufficient documentation

## 2014-01-28 DIAGNOSIS — Y92008 Other place in unspecified non-institutional (private) residence as the place of occurrence of the external cause: Secondary | ICD-10-CM | POA: Diagnosis not present

## 2014-01-28 DIAGNOSIS — Z7982 Long term (current) use of aspirin: Secondary | ICD-10-CM | POA: Diagnosis not present

## 2014-01-28 DIAGNOSIS — F10920 Alcohol use, unspecified with intoxication, uncomplicated: Secondary | ICD-10-CM

## 2014-01-28 DIAGNOSIS — Z7951 Long term (current) use of inhaled steroids: Secondary | ICD-10-CM | POA: Diagnosis not present

## 2014-01-28 DIAGNOSIS — W19XXXA Unspecified fall, initial encounter: Secondary | ICD-10-CM

## 2014-01-28 DIAGNOSIS — Y9389 Activity, other specified: Secondary | ICD-10-CM | POA: Insufficient documentation

## 2014-01-28 LAB — CBC WITH DIFFERENTIAL/PLATELET
BASOS ABS: 0.1 10*3/uL (ref 0.0–0.1)
BASOS PCT: 1 % (ref 0–1)
EOS PCT: 2 % (ref 0–5)
Eosinophils Absolute: 0.2 10*3/uL (ref 0.0–0.7)
HEMATOCRIT: 34 % — AB (ref 39.0–52.0)
Hemoglobin: 10.2 g/dL — ABNORMAL LOW (ref 13.0–17.0)
Lymphocytes Relative: 35 % (ref 12–46)
Lymphs Abs: 2.9 10*3/uL (ref 0.7–4.0)
MCH: 21.3 pg — ABNORMAL LOW (ref 26.0–34.0)
MCHC: 30 g/dL (ref 30.0–36.0)
MCV: 70.8 fL — ABNORMAL LOW (ref 78.0–100.0)
Monocytes Absolute: 0.5 10*3/uL (ref 0.1–1.0)
Monocytes Relative: 6 % (ref 3–12)
Neutro Abs: 4.5 10*3/uL (ref 1.7–7.7)
Neutrophils Relative %: 56 % (ref 43–77)
Platelets: 401 10*3/uL — ABNORMAL HIGH (ref 150–400)
RBC: 4.8 MIL/uL (ref 4.22–5.81)
RDW: 21.3 % — ABNORMAL HIGH (ref 11.5–15.5)
WBC: 8.2 10*3/uL (ref 4.0–10.5)

## 2014-01-28 LAB — COMPREHENSIVE METABOLIC PANEL
ALK PHOS: 86 U/L (ref 39–117)
ALT: 20 U/L (ref 0–53)
AST: 29 U/L (ref 0–37)
Albumin: 4.4 g/dL (ref 3.5–5.2)
Anion gap: 11 (ref 5–15)
BUN: 7 mg/dL (ref 6–23)
CO2: 24 mmol/L (ref 19–32)
Calcium: 9.7 mg/dL (ref 8.4–10.5)
Chloride: 105 mEq/L (ref 96–112)
Creatinine, Ser: 0.86 mg/dL (ref 0.50–1.35)
GFR calc non Af Amer: >90 mL/min (ref >90)
GLUCOSE: 73 mg/dL (ref 70–99)
Potassium: 3.3 mmol/L — ABNORMAL LOW (ref 3.5–5.1)
SODIUM: 140 mmol/L (ref 135–145)
Total Bilirubin: 0.2 mg/dL — ABNORMAL LOW (ref 0.3–1.2)
Total Protein: 7.6 g/dL (ref 6.0–8.3)

## 2014-01-28 LAB — URINALYSIS, ROUTINE W REFLEX MICROSCOPIC
Bilirubin Urine: NEGATIVE
Glucose, UA: NEGATIVE mg/dL
Hgb urine dipstick: NEGATIVE
Ketones, ur: NEGATIVE mg/dL
Leukocytes, UA: NEGATIVE
Nitrite: NEGATIVE
PROTEIN: NEGATIVE mg/dL
Specific Gravity, Urine: 1.005 (ref 1.005–1.030)
UROBILINOGEN UA: 0.2 mg/dL (ref 0.0–1.0)
pH: 5.5 (ref 5.0–8.0)

## 2014-01-28 LAB — TYPE AND SCREEN
ABO/RH(D): O POS
Antibody Screen: NEGATIVE

## 2014-01-28 LAB — I-STAT TROPONIN, ED: Troponin i, poc: 0.01 ng/mL (ref 0.00–0.08)

## 2014-01-28 LAB — ETHANOL: Alcohol, Ethyl (B): 337 mg/dL — ABNORMAL HIGH (ref 0–9)

## 2014-01-28 LAB — CBG MONITORING, ED: GLUCOSE-CAPILLARY: 91 mg/dL (ref 70–99)

## 2014-01-28 LAB — RAPID URINE DRUG SCREEN, HOSP PERFORMED
Amphetamines: NOT DETECTED
BENZODIAZEPINES: POSITIVE — AB
Barbiturates: NOT DETECTED
COCAINE: NOT DETECTED
OPIATES: NOT DETECTED
TETRAHYDROCANNABINOL: NOT DETECTED

## 2014-01-28 LAB — PROTIME-INR
INR: 1.04 (ref 0.00–1.49)
Prothrombin Time: 13.8 seconds (ref 11.6–15.2)

## 2014-01-28 MED ORDER — SODIUM CHLORIDE 0.9 % IV SOLN
Freq: Once | INTRAVENOUS | Status: AC
  Administered 2014-01-28: 1000 mL via INTRAVENOUS

## 2014-01-28 MED ORDER — LIDOCAINE-EPINEPHRINE (PF) 2 %-1:200000 IJ SOLN
20.0000 mL | Freq: Once | INTRAMUSCULAR | Status: AC
  Administered 2014-01-28: 10 mL
  Filled 2014-01-28: qty 20
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
## 2014-01-28 NOTE — ED Notes (Signed)
Patient belongings - jeans, black belt, socks, blk/silver cross on silver beaded necklace, lighter, glasses (black rim), 8 quarters, 6 dimes, 2 nickels, 12 pennies, 3 $5, 1 $1, brown wallet, drivers license, pictures, bills, boots  Verified by Alysia Penna, EMT
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
## 2014-01-29 MED ORDER — TETANUS-DIPHTHERIA TOXOIDS TD 5-2 LFU IM INJ
0.5000 mL | INJECTION | Freq: Once | INTRAMUSCULAR | Status: AC
  Administered 2014-01-29: 0.5 mL via INTRAMUSCULAR
  Filled 2014-01-29: qty 0.5

## 2014-01-29 NOTE — ED Notes (Addendum)
Patient has reached a friend that will be here in about an hour to pick him up. He is up and ambulatory to restroom. Gait is steady

## 2014-01-29 NOTE — Discharge Instructions (Signed)
You have 4 staples and 2 sutures in your scalp. Please have them removed in 10 days. Return to the ED immediately for fever, pus draining from wound, or other signs of infection.   Emergency Department Resource Guide 1) Find a Doctor and Pay Out of Pocket Although you won't have to find out who is covered by your insurance plan, it is a good idea to ask around and get recommendations. You will then need to call the office and see if the doctor you have chosen will accept you as a new patient and what types of options they offer for patients who are self-pay. Some doctors offer discounts or will set up payment plans for their patients who do not have insurance, but you will need to ask so you aren't surprised when you get to your appointment.  2) Contact Your Local Health Department Not all health departments have doctors that can see patients for sick visits, but many do, so it is worth a call to see if yours does. If you don't know where your local health department is, you can check in your phone book. The CDC also has a tool to help you locate your state's health department, and many state websites also have listings of all of their local health departments.  3) Find a [facility] If your illness is not likely to be very severe or complicated, you may want to try a walk in clinic. These are popping up all over the country in pharmacies, drugstores, and shopping centers. They're usually staffed by nurse practitioners or physician assistants that have been trained to treat common illnesses and complaints. They're usually fairly quick and inexpensive. However, if you have serious medical issues or chronic medical problems, these are probably not your best option.  No Primary Care Doctor: - Call Health Connect at  [phone] - they can help you locate a primary care doctor that  accepts your insurance, provides certain services, etc. - Physician Referral Service- [phone]  Chronic Pain  Problems: Organization         Address  Phone   Notes  [facility]  [phone] Patients need to be referred by their primary care doctor.   Medication Assistance: Organization         Address  Phone   Notes  [facility] Medication [facility] [address] [place], [place] [address] [phone] --Must be a resident of [facility] -- Must have NO insurance coverage whatsoever (no Medicaid/ Medicare, etc.) -- The pt. MUST have a primary care doctor that directs their care regularly and follows them in the community   [facility]  [phone]   [employer]  [phone]    Agencies that provide inexpensive medical care: Organization         Address  Phone   Notes  [place]  [phone]   [facility]    [phone]   [facility] [place], [place] [address] [phone]   [place] [address] [place] [phone]   Planned Parenthood    [phone]   [facility]    [phone]   [place] and [place] [address], [place] Phone:  [phone], Fax:  [phone] Hours of Operation:  9 am - 6 pm, M-F.  Also accepts Medicaid/Medicare and self-pay.  [facility]  [place] [employer], [address], [place] Phone: [phone], Fax: [phone]. Hours of Operation:  8:30 am - 5:30 pm, M-F.  Also accepts Medicaid and self-pay.  [facility] [place] [address], [place] Phone: [phone]   [place], [place], [place] [phone], Ext. [phone] Mondays & Thursdays: 7-9 AM.  First 15 patients are seen on a first come, first serve basis.    [place] Providers:  Organization         Address  Phone   Notes  [facility] [address], [place] [phone] Also  accepts self-pay patients.  [facility] [address], [place]  [phone]   [place], [address], [place] [phone]   [facility] [address], [place] [phone]   Lucianne Lei [address], [place]   [phone] Only accepts [place] Access [place] patients after they have their name applied to their card.   Self-Pay (no insurance) in [facility]:  Organization         Address  Phone   Notes  Sickle Cell Patients, [facility] [place] [phone]   [facility] [place] [phone]   [facility] [place]  [place], [place], [place] [phone]   [facility]/Dr. Osei-Bonsu  [address] [place] or [address], [place] [phone] Phone number for both [place] and [place] locations is the same.  Urgent Medical and [facility] [address], [place] [phone]   [facility] [address] [place] or [address] [phone]   [facility] [address] [place] [phone], phone; [phone], fax Sees patients 1st and 3rd Saturday of every month.  Must not qualify for public or private insurance (i.e. Medicaid, Medicare, [place] Health Choice, Veterans' Benefits)  Household income should be no more than 200% of the poverty level The clinic cannot treat you if you are pregnant or think you are pregnant  Sexually transmitted diseases are not treated at the clinic.    Dental Care: Organization         Address  Phone  Notes  [facility] Department of [facility] [place] [phone] Accepts children up to age 37 who are enrolled in [place] or [place]; pregnant  women with a Medicaid card; and children who have applied for Medicaid or [place] Health Choice, but were declined, whose parents can pay a reduced fee at time of service.  [facility] Department of [facility]  [address], [place] [phone] Accepts children up to age 58 who are enrolled in [place] or [place]; pregnant women with a Medicaid card; and children who have applied for Medicaid or [place] Health Choice, but were declined, whose parents can pay a reduced fee at time of service.  [place] Adult [facility] Access PROGRAM  [place] [phone] Patients are seen by appointment only. Walk-ins are not accepted. [place] will see patients 22 years of age and older. Monday - Tuesday (8am-5pm) Most Wednesdays (8:30-5pm) $30 per visit, cash only  [place] Adult  Dental Access PROGRAM  [address], [facility] [phone] Patients are seen by appointment only. Walk-ins are not accepted. [place] will see patients 60 years of age and older. One Wednesday Evening (Monthly: Volunteer Based).  $30 per visit, cash only  [place]  [phone] for adults; Children under age 4, call Graduate Pediatric Dentistry at [phone]. Children aged 49-14, please call [phone] to request a pediatric application.  Dental services are provided in all areas of dental care including fillings, crowns and bridges, complete and partial dentures, implants, gum treatment, root canals, and extractions. Preventive care is also provided. Treatment is provided to both adults and children. Patients are selected via a lottery and there is often a waiting list.   [facility] [address] [place]  [phone] [email or web address]   [facility] [address] [place], [place] [phone], Ext. [phone] Second and Fourth Thursday of each month, opens at 6:30 AM; Clinic ends at 9 AM.  Patients are  seen on a first-come first-served basis, and a limited number are seen during each clinic.   [facility]  [address] Hillard Danker [place], [place] [phone]   Eligibility Requirements You must have lived in [place], [place], or [place] counties for at least the last three months.   You cannot be eligible for state or federal sponsored [employer], including [employer], [place], or [employer].   You generally cannot be eligible for healthcare insurance through your employer.    How to apply: Eligibility screenings are held every Tuesday and Wednesday afternoon from 1:00 pm until 4:00 pm. You do not need an appointment for the interview!  [facility] [address] [place], [place]   [place]  [place] Department  [place]  [phone]    Behavioral Health Resources in the Community: Intensive Outpatient Programs Organization         Address  Phone  Notes  [place]. [address], [place], [place] [phone]   [facility] Outpatient [address], [place], [place]   ADS: Alcohol & Drug Svcs [address], [place], [place]   [place] [address],  [place], [place] or [phone]   Substance Abuse Resources Organization         Address  Phone  Notes  Alcohol and Drug Services  [phone]   [place]  [phone]   [facility] [place]   Chinita Pester  [phone]   Residential & Outpatient Substance Abuse Program  [phone]   Psychological Services Organization         Address  Phone  Notes  [facility] [place]  [place]  [phone]   [place] [address] [place] [phone] or [phone]    Mobile Crisis  Teams Organization         Address  Phone  Notes  [facility], Mobile Crisis Care Unit  [phone]   [facility]ive Psychotherapeutic Services  [address] [place], [place]   [place] Levels [address] [place] [phone]    Self-Help/Support Groups Organization         Address  Phone             Notes  Mental  Health Assoc. of [place] - variety of support groups  [place] Call for more information  Narcotics Anonymous (NA), [facility]aring [facility] [address], [employer] [place]  2 meetings at this location   Special educational needs teacher         Address  Phone  Notes  ASAP Residential Treatment [place],    [place]  [phone]   [facility]  [address], [place] [address], [place], [place]   [place], [place] [phone] Admissions: 8am-3pm M-F  Incentives Substance [place] [address],    [place], [place] [phone]   [facility] [address] [place], [place], [place]   [facility] [address]  [place], [place]   [facility] Programs - Intensive Outpatient [address], [place], [place]   [facility].,  [place], [place] [phone] or [phone]   [facility]idential Treatment Services (RTS) [address] [place], [place] Accepts Medicaid  [facility] [place] [address],  [place] [phone] Substance Abuse/Addiction Treatment   [facility] Organization         Address  Phone  Notes  [facility]  [phone]   Domenic Schwab, PhD [address] [place]lis [place], [place]   [phone] or [phone]   [place], [place] [phone]   [facility] [address], [place], [place] [phone]  Insurance/Medicaid/sponsorship through [facility] and Families [address] [address]                                    [place], [place] [phone] [place] [address], [place] [phone]    Dr. Adele Schilder  [phone]   [facility] Dept. 1) [address] [place] 2) [place] 3)  [place] [address], [place] [phone]  [phone]   [place] [phone] or [phone] (After Hours)

## 2014-01-29 NOTE — ED Notes (Signed)
Awoke patient to check mental status, cooperative however alert to self only, thinks he's in [place] and its 1916. Some slurred speech. Strong odor of alcohol.

## 2014-01-29 NOTE — ED Notes (Signed)
Prompted patient to call girlfriend for ride again.

## 2014-01-29 NOTE — ED Notes (Signed)
Pt sitting on side of bed eating breakfast.

## 2014-01-29 NOTE — ED Notes (Signed)
Report given to GABE, RN  Moved to [facility]

## 2014-01-29 NOTE — ED Notes (Signed)
PT monitored by pulse ox, bp cuff, and 5-lead. RN Chester Holstein and RN Raquel Sarna informed of pts arrival to room.

## 2014-01-29 NOTE — ED Notes (Signed)
Pt awake and alert. He is on the phone trying to find a ride home. His speech is clear.

## 2014-02-09 ENCOUNTER — Telehealth: Payer: Self-pay | Admitting: Cardiovascular Disease

## 2014-02-09 NOTE — Telephone Encounter (Signed)
New Msg        Pt calling, wants to know the name of the new BP medication that Dr. Johnsie Cancel wanted him to try.   Please contact pt at sister's home [phone].

## 2014-02-09 NOTE — Telephone Encounter (Signed)
F/U ° ° ° ° ° ° ° ° ° °Pt returning call. Please call back.  °

## 2014-02-09 NOTE — Telephone Encounter (Signed)
LMTCB ./CY 

## 2014-02-09 NOTE — Telephone Encounter (Signed)
PT  AWARE  MAY  HAVE PMD  START   LOSARTAN  50 MG  PER LAST OFFICE NOTE .Adonis Housekeeper

## 2014-03-02 ENCOUNTER — Encounter (HOSPITAL_COMMUNITY): Payer: Self-pay | Admitting: Anesthesiology

## 2014-03-02 NOTE — Anesthesia Preprocedure Evaluation (Deleted)
Anesthesia Evaluation  Patient identified by MRN, date of birth, ID band Patient awake    Reviewed: Allergy & Precautions, NPO status , Patient's Chart, lab work & pertinent test results, reviewed documented beta blocker date and time   Airway        Dental   Pulmonary Current Smoker,          Cardiovascular hypertension, Pt. on medications + CAD (graft angioplasty 2009) and + Past MI CABG: 2008.  EKG LBB   Neuro/Psych Anxiety LICA 98% stenosis, RCA 59% stenosis, no TIA    GI/Hepatic PUD, (+)     substance abuse  alcohol use, LFT normal   Endo/Other    Renal/GU      Musculoskeletal negative musculoskeletal ROS (+)   Abdominal   Peds  Hematology 10/34 H/H   Anesthesia Other Findings   Reproductive/Obstetrics                             Anesthesia Physical Anesthesia Plan  ASA: III  Anesthesia Plan: General   Post-op Pain Management:    Induction: Intravenous  Airway Management Planned: Oral ETT  Additional Equipment:   Intra-op Plan:   Post-operative Plan: Extubation in OR  Informed Consent: I have reviewed the patients History and Physical, chart, labs and discussed the procedure including the risks, benefits and alternatives for the proposed anesthesia with the patient or authorized representative who has indicated his/her understanding and acceptance.     Plan Discussed with:   Anesthesia Plan Comments: (Multimodal pain RX, 2nd IV after induction)        Anesthesia Quick Evaluation

## 2014-03-03 ENCOUNTER — Encounter (HOSPITAL_COMMUNITY): Admission: RE | Payer: Self-pay | Source: Ambulatory Visit

## 2014-03-03 ENCOUNTER — Inpatient Hospital Stay (HOSPITAL_COMMUNITY): Admission: RE | Admit: 2014-03-03 | Payer: Medicaid Other | Source: Ambulatory Visit | Admitting: Orthopedic Surgery

## 2014-03-03 SURGERY — POSTERIOR LUMBAR FUSION 1 LEVEL
Anesthesia: General

## 2014-05-18 ENCOUNTER — Encounter (HOSPITAL_COMMUNITY): Payer: Self-pay

## 2014-05-18 ENCOUNTER — Encounter (HOSPITAL_COMMUNITY)
Admission: RE | Admit: 2014-05-18 | Discharge: 2014-05-18 | Disposition: A | Discharge status: Home / Self Care | Payer: Medicaid Other | Source: Ambulatory Visit | Attending: Orthopedic Surgery | Admitting: Orthopedic Surgery

## 2014-05-18 DIAGNOSIS — Z01812 Encounter for preprocedural laboratory examination: Secondary | ICD-10-CM | POA: Diagnosis not present

## 2014-05-18 HISTORY — DX: Unspecified osteoarthritis, unspecified site: M19.90

## 2014-05-18 HISTORY — DX: Accidental discharge from unspecified firearms or gun, initial encounter: W34.00XA

## 2014-05-18 HISTORY — DX: Gastro-esophageal reflux disease without esophagitis: K21.9

## 2014-05-18 HISTORY — DX: Chronic obstructive pulmonary disease, unspecified: J44.9

## 2014-05-18 LAB — COMPREHENSIVE METABOLIC PANEL
ALT: 31 U/L (ref 0–53)
AST: 33 U/L (ref 0–37)
Albumin: 4.3 g/dL (ref 3.5–5.2)
Alkaline Phosphatase: 101 U/L (ref 39–117)
Anion gap: 9 (ref 5–15)
BILIRUBIN TOTAL: 0.7 mg/dL (ref 0.3–1.2)
BUN: 7 mg/dL (ref 6–23)
CALCIUM: 10 mg/dL (ref 8.4–10.5)
CO2: 25 mmol/L (ref 19–32)
Chloride: 104 mmol/L (ref 96–112)
Creatinine, Ser: 0.8 mg/dL (ref 0.50–1.35)
GFR calc Af Amer: >90 mL/min (ref >90)
GFR calc non Af Amer: >90 mL/min (ref >90)
Glucose, Bld: 104 mg/dL — ABNORMAL HIGH (ref 70–99)
Potassium: 4.4 mmol/L (ref 3.5–5.1)
Sodium: 138 mmol/L (ref 135–145)
TOTAL PROTEIN: 7.3 g/dL (ref 6.0–8.3)

## 2014-05-18 LAB — TYPE AND SCREEN
ABO/RH(D): O POS
ANTIBODY SCREEN: NEGATIVE

## 2014-05-18 LAB — CBC
HCT: 46 % (ref 39.0–52.0)
HEMOGLOBIN: 15.7 g/dL (ref 13.0–17.0)
MCH: 30 pg (ref 26.0–34.0)
MCHC: 34.1 g/dL (ref 30.0–36.0)
MCV: 88 fL (ref 78.0–100.0)
Platelets: 279 10*3/uL (ref 150–400)
RBC: 5.23 MIL/uL (ref 4.22–5.81)
RDW: 19.5 % — ABNORMAL HIGH (ref 11.5–15.5)
WBC: 11 10*3/uL — AB (ref 4.0–10.5)

## 2014-05-18 LAB — SURGICAL PCR SCREEN
MRSA, PCR: NEGATIVE
STAPHYLOCOCCUS AUREUS: NEGATIVE

## 2014-05-18 NOTE — Pre-Procedure Instructions (Signed)
[name]  05/18/2014   Your procedure is scheduled on:   Thursday  05/20/14  Report to [facility] Admitting at 530 AM.  Call this number if you have problems the morning of surgery: [phone]   Remember:   Do not eat food or drink liquids after midnight.   Take these medicines the morning of surgery with A SIP OF WATER:  ALPRAZOLAM (XANAX), AMLODIPINE (NORVASC), SYMBICORT INHALER, METOPROLOL(LOPRESSOR), OMEPRAZOLE (PRILOSEC), FLOMAX,  HYDROCODONE IF NEEDED            (STOP CO Q 10, MULTIVITAMIN, FISH OIL, ASPIRIN, PLAVIX, VITAMINS)  Do not wear jewelry, make-up or nail polish.  Do not wear lotions, powders, or perfumes. You may wear deodorant.  Do not shave 48 hours prior to surgery. Men may shave face and neck.  Do not bring valuables to the hospital.  [facility] is not responsible                  for any belongings or valuables.               Contacts, dentures or bridgework may not be worn into surgery.  Leave suitcase in the car. After surgery it may be brought to your room.  For patients admitted to the hospital, discharge time is determined by your                treatment team.               Patients discharged the day of surgery will not be allowed to drive  home.  Name and phone number of your driver:   Special Instructions: [place] - Preparing for Surgery  Before surgery, you can play an important role.  Because skin is not sterile, your skin needs to be as free of germs as possible.  You can reduce the number of germs on you skin by washing with CHG (chlorahexidine gluconate) soap before surgery.  CHG is an antiseptic cleaner which kills germs and bonds with the skin to continue killing germs even after washing.  Please DO NOT use if you have an allergy to CHG or antibacterial soaps.  If your skin becomes reddened/irritated stop using the CHG and inform your nurse when you arrive at Short Stay.  Do not shave (including legs and underarms) for at  least 48 hours prior to the first CHG shower.  You may shave your face.  Please follow these instructions carefully:   1.  Shower with CHG Soap the night before surgery and the                                morning of Surgery.  2.  If you choose to wash your hair, wash your hair first as usual with your       normal shampoo.  3.  After you shampoo, rinse your hair and body thoroughly to remove the                      Shampoo.  4.  Use CHG as you would any other liquid soap.  You can apply chg directly       to the skin and wash gently with scrungie or a clean washcloth.  5.  Apply the CHG Soap to your body ONLY FROM THE NECK DOWN.        Do not use on open  wounds or open sores.  Avoid contact with your eyes,       ears, mouth and genitals (private parts).  Wash genitals (private parts)       with your normal soap.  6.  Wash thoroughly, paying special attention to the area where your surgery        will be performed.  7.  Thoroughly rinse your body with warm water from the neck down.  8.  DO NOT shower/wash with your normal soap after using and rinsing off       the CHG Soap.  9.  Pat yourself dry with a clean towel.            10.  Wear clean pajamas.            11.  Place clean sheets on your bed the night of your first shower and do not        sleep with pets.  Day of Surgery  Do not apply any lotions/deoderants the morning of surgery.  Please wear clean clothes to the hospital/surgery center.     Please read over the following fact sheets that you were given: Pain Booklet, Coughing and Deep Breathing, Blood Transfusion Information, MRSA Information and Surgical Site Infection Prevention

## 2014-05-19 MED ORDER — CEFAZOLIN SODIUM-DEXTROSE 2-3 GM-% IV SOLR
2.0000 g | INTRAVENOUS | Status: AC
  Administered 2014-05-20: 2 g via INTRAVENOUS
  Filled 2014-05-19: qty 50
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
## 2014-05-20 ENCOUNTER — Encounter (HOSPITAL_COMMUNITY): Payer: Self-pay | Admitting: *Deleted

## 2014-05-20 ENCOUNTER — Inpatient Hospital Stay (HOSPITAL_COMMUNITY): Payer: Medicaid Other | Admitting: Certified Registered Nurse Anesthetist

## 2014-05-20 ENCOUNTER — Encounter (HOSPITAL_COMMUNITY)
Admission: RE | Disposition: A | Discharge status: Home Health | Payer: Self-pay | Source: Ambulatory Visit | Attending: Orthopedic Surgery

## 2014-05-20 ENCOUNTER — Inpatient Hospital Stay (HOSPITAL_COMMUNITY): Payer: Medicaid Other

## 2014-05-20 ENCOUNTER — Inpatient Hospital Stay (HOSPITAL_COMMUNITY): Payer: Medicaid Other | Admitting: Vascular Surgery

## 2014-05-20 ENCOUNTER — Inpatient Hospital Stay (HOSPITAL_COMMUNITY)
Admission: RE | Admit: 2014-05-20 | Discharge: 2014-05-22 | DRG: 460 | Disposition: A | Discharge status: Home Health | Payer: Medicaid Other | Source: Ambulatory Visit | Attending: Orthopedic Surgery | Admitting: Orthopedic Surgery

## 2014-05-20 DIAGNOSIS — Z23 Encounter for immunization: Secondary | ICD-10-CM

## 2014-05-20 DIAGNOSIS — I2510 Atherosclerotic heart disease of native coronary artery without angina pectoris: Secondary | ICD-10-CM | POA: Diagnosis present

## 2014-05-20 DIAGNOSIS — Y838 Other surgical procedures as the cause of abnormal reaction of the patient, or of later complication, without mention of misadventure at the time of the procedure: Secondary | ICD-10-CM | POA: Diagnosis present

## 2014-05-20 DIAGNOSIS — M069 Rheumatoid arthritis, unspecified: Secondary | ICD-10-CM | POA: Diagnosis present

## 2014-05-20 DIAGNOSIS — K219 Gastro-esophageal reflux disease without esophagitis: Secondary | ICD-10-CM | POA: Diagnosis present

## 2014-05-20 DIAGNOSIS — I252 Old myocardial infarction: Secondary | ICD-10-CM

## 2014-05-20 DIAGNOSIS — M549 Dorsalgia, unspecified: Secondary | ICD-10-CM | POA: Diagnosis present

## 2014-05-20 DIAGNOSIS — J449 Chronic obstructive pulmonary disease, unspecified: Secondary | ICD-10-CM | POA: Diagnosis present

## 2014-05-20 DIAGNOSIS — E780 Pure hypercholesterolemia: Secondary | ICD-10-CM | POA: Diagnosis present

## 2014-05-20 DIAGNOSIS — Z951 Presence of aortocoronary bypass graft: Secondary | ICD-10-CM

## 2014-05-20 DIAGNOSIS — I10 Essential (primary) hypertension: Secondary | ICD-10-CM | POA: Diagnosis present

## 2014-05-20 DIAGNOSIS — K5900 Constipation, unspecified: Secondary | ICD-10-CM | POA: Diagnosis not present

## 2014-05-20 DIAGNOSIS — M960 Pseudarthrosis after fusion or arthrodesis: Principal | ICD-10-CM | POA: Diagnosis present

## 2014-05-20 DIAGNOSIS — F419 Anxiety disorder, unspecified: Secondary | ICD-10-CM | POA: Diagnosis present

## 2014-05-20 DIAGNOSIS — E785 Hyperlipidemia, unspecified: Secondary | ICD-10-CM | POA: Diagnosis present

## 2014-05-20 DIAGNOSIS — I739 Peripheral vascular disease, unspecified: Secondary | ICD-10-CM | POA: Diagnosis present

## 2014-05-20 DIAGNOSIS — Z87891 Personal history of nicotine dependence: Secondary | ICD-10-CM

## 2014-05-20 DIAGNOSIS — Z419 Encounter for procedure for purposes other than remedying health state, unspecified: Secondary | ICD-10-CM

## 2014-05-20 HISTORY — PX: HARVEST BONE GRAFT: SHX377

## 2014-05-20 HISTORY — PX: LUMBAR FUSION: SHX111

## 2014-05-20 SURGERY — POSTERIOR LUMBAR FUSION 2 LEVEL
Anesthesia: General | Site: Back

## 2014-05-20 MED ORDER — DEXAMETHASONE SODIUM PHOSPHATE 4 MG/ML IJ SOLN
INTRAMUSCULAR | Status: DC | PRN
  Administered 2014-05-20: 10 mg via INTRAVENOUS

## 2014-05-20 MED ORDER — BUPIVACAINE-EPINEPHRINE 0.25% -1:200000 IJ SOLN
INTRAMUSCULAR | Status: DC | PRN
  Administered 2014-05-20: 20 mL

## 2014-05-20 MED ORDER — FOLIC ACID 400 MCG PO TABS: 400.0000 ug | ORAL_TABLET | Freq: Every day | ORAL | Status: DC

## 2014-05-20 MED ORDER — BUPIVACAINE-EPINEPHRINE (PF) 0.25% -1:200000 IJ SOLN
INTRAMUSCULAR | Status: AC
  Filled 2014-05-20: qty 30

## 2014-05-20 MED ORDER — THROMBIN 5000 UNITS EX SOLR
CUTANEOUS | Status: AC
  Filled 2014-05-20: qty 20000

## 2014-05-20 MED ORDER — PROPOFOL 10 MG/ML IV BOLUS
INTRAVENOUS | Status: DC | PRN
  Administered 2014-05-20: 180 mg via INTRAVENOUS

## 2014-05-20 MED ORDER — NEOSTIGMINE METHYLSULFATE 10 MG/10ML IV SOLN
INTRAVENOUS | Status: DC | PRN
Start: 2014-05-20 — End: 2014-05-20
  Administered 2014-05-20: 3 mg via INTRAVENOUS

## 2014-05-20 MED ORDER — HYDROMORPHONE HCL 1 MG/ML IJ SOLN
INTRAMUSCULAR | Status: AC
  Filled 2014-05-20: qty 2

## 2014-05-20 MED ORDER — METOPROLOL TARTRATE 50 MG PO TABS
50.0000 mg | ORAL_TABLET | Freq: Two times a day (BID) | ORAL | Status: DC
  Administered 2014-05-20 – 2014-05-22 (×3): 50 mg via ORAL
  Filled 2014-05-20: qty 1
  Filled 2014-05-20 (×2): qty 2
  Filled 2014-05-20: qty 1

## 2014-05-20 MED ORDER — NEOSTIGMINE METHYLSULFATE 10 MG/10ML IV SOLN
INTRAVENOUS | Status: AC
  Filled 2014-05-20: qty 4

## 2014-05-20 MED ORDER — MORPHINE SULFATE 2 MG/ML IJ SOLN
1.0000 mg | INTRAMUSCULAR | Status: DC | PRN
  Administered 2014-05-20 – 2014-05-21 (×3): 4 mg via INTRAVENOUS
  Filled 2014-05-20 (×3): qty 2

## 2014-05-20 MED ORDER — HEMOSTATIC AGENTS (NO CHARGE) OPTIME
TOPICAL | Status: DC | PRN
  Administered 2014-05-20: 1 via TOPICAL

## 2014-05-20 MED ORDER — ONDANSETRON HCL 4 MG/2ML IJ SOLN
INTRAMUSCULAR | Status: AC
  Filled 2014-05-20: qty 2

## 2014-05-20 MED ORDER — EPHEDRINE SULFATE 50 MG/ML IJ SOLN
INTRAMUSCULAR | Status: AC
  Filled 2014-05-20: qty 1

## 2014-05-20 MED ORDER — ACETAMINOPHEN 10 MG/ML IV SOLN
INTRAVENOUS | Status: DC | PRN
  Administered 2014-05-20: 1000 mg via INTRAVENOUS

## 2014-05-20 MED ORDER — ACETAMINOPHEN 10 MG/ML IV SOLN
1000.0000 mg | Freq: Four times a day (QID) | INTRAVENOUS | Status: AC
  Administered 2014-05-20 – 2014-05-21 (×4): 1000 mg via INTRAVENOUS
  Filled 2014-05-20 (×4): qty 100

## 2014-05-20 MED ORDER — HYDROMORPHONE HCL 1 MG/ML IJ SOLN
0.2500 mg | INTRAMUSCULAR | Status: DC | PRN
  Administered 2014-05-20 (×5): 0.5 mg via INTRAVENOUS

## 2014-05-20 MED ORDER — GLYCOPYRROLATE 0.2 MG/ML IJ SOLN
INTRAMUSCULAR | Status: DC | PRN
  Administered 2014-05-20: .4 mg via INTRAVENOUS

## 2014-05-20 MED ORDER — HYDROMORPHONE HCL 1 MG/ML IJ SOLN
0.5000 mg | Freq: Once | INTRAMUSCULAR | Status: AC
  Administered 2014-05-20: 0.5 mg via INTRAVENOUS

## 2014-05-20 MED ORDER — LACTATED RINGERS IV SOLN
INTRAVENOUS | Status: DC | PRN
  Administered 2014-05-20 (×3): via INTRAVENOUS

## 2014-05-20 MED ORDER — LIDOCAINE HCL (CARDIAC) 20 MG/ML IV SOLN
INTRAVENOUS | Status: AC
  Filled 2014-05-20: qty 5

## 2014-05-20 MED ORDER — MIDAZOLAM HCL 5 MG/5ML IJ SOLN
INTRAMUSCULAR | Status: DC | PRN
  Administered 2014-05-20: 2 mg via INTRAVENOUS

## 2014-05-20 MED ORDER — ACETAMINOPHEN 10 MG/ML IV SOLN
INTRAVENOUS | Status: AC
  Filled 2014-05-20: qty 100

## 2014-05-20 MED ORDER — FOLIC ACID 1 MG PO TABS
0.5000 mg | ORAL_TABLET | Freq: Every day | ORAL | Status: DC
  Administered 2014-05-20 – 2014-05-22 (×3): 0.5 mg via ORAL
  Filled 2014-05-20 (×3): qty 1

## 2014-05-20 MED ORDER — MIDAZOLAM HCL 2 MG/2ML IJ SOLN
INTRAMUSCULAR | Status: AC
  Filled 2014-05-20: qty 2

## 2014-05-20 MED ORDER — SODIUM CHLORIDE 0.9 % IJ SOLN
3.0000 mL | Freq: Two times a day (BID) | INTRAMUSCULAR | Status: DC
  Administered 2014-05-20 – 2014-05-22 (×3): 3 mL via INTRAVENOUS

## 2014-05-20 MED ORDER — POLYSACCHARIDE IRON COMPLEX 150 MG PO CAPS
150.0000 mg | ORAL_CAPSULE | Freq: Every day | ORAL | Status: DC
  Administered 2014-05-21 – 2014-05-22 (×2): 150 mg via ORAL
  Filled 2014-05-20 (×2): qty 1

## 2014-05-20 MED ORDER — ROCURONIUM BROMIDE 50 MG/5ML IV SOLN
INTRAVENOUS | Status: AC
  Filled 2014-05-20: qty 1

## 2014-05-20 MED ORDER — PROPOFOL 10 MG/ML IV BOLUS
INTRAVENOUS | Status: AC
  Filled 2014-05-20: qty 20

## 2014-05-20 MED ORDER — ALPRAZOLAM 0.5 MG PO TABS
1.0000 mg | ORAL_TABLET | Freq: Once | ORAL | Status: DC
  Filled 2014-05-20: qty 2

## 2014-05-20 MED ORDER — LACTATED RINGERS IV SOLN
INTRAVENOUS | Status: DC
  Administered 2014-05-21: 13:00:00 via INTRAVENOUS

## 2014-05-20 MED ORDER — MENTHOL 3 MG MT LOZG: 1.0000 | LOZENGE | OROMUCOSAL | Status: DC | PRN

## 2014-05-20 MED ORDER — AMLODIPINE BESYLATE 10 MG PO TABS
10.0000 mg | ORAL_TABLET | Freq: Every day | ORAL | Status: DC
  Administered 2014-05-21 – 2014-05-22 (×2): 10 mg via ORAL
  Filled 2014-05-20 (×2): qty 1

## 2014-05-20 MED ORDER — SODIUM CHLORIDE 0.9 % IJ SOLN
INTRAMUSCULAR | Status: AC
  Filled 2014-05-20: qty 10

## 2014-05-20 MED ORDER — ALPRAZOLAM 0.5 MG PO TABS
1.0000 mg | ORAL_TABLET | Freq: Three times a day (TID) | ORAL | Status: DC
  Administered 2014-05-20 – 2014-05-22 (×8): 1 mg via ORAL
  Filled 2014-05-20 (×7): qty 2

## 2014-05-20 MED ORDER — FENTANYL CITRATE (PF) 100 MCG/2ML IJ SOLN
INTRAMUSCULAR | Status: DC | PRN
  Administered 2014-05-20 (×2): 100 ug via INTRAVENOUS

## 2014-05-20 MED ORDER — ONDANSETRON HCL 4 MG/2ML IJ SOLN: 4.0000 mg | Freq: Four times a day (QID) | INTRAMUSCULAR | Status: DC | PRN

## 2014-05-20 MED ORDER — TAMSULOSIN HCL 0.4 MG PO CAPS
0.4000 mg | ORAL_CAPSULE | Freq: Every day | ORAL | Status: DC
  Administered 2014-05-20 – 2014-05-22 (×3): 0.4 mg via ORAL
  Filled 2014-05-20 (×3): qty 1

## 2014-05-20 MED ORDER — OXYCODONE HCL 5 MG PO TABS
5.0000 mg | ORAL_TABLET | Freq: Once | ORAL | Status: AC | PRN
  Administered 2014-05-20: 5 mg via ORAL

## 2014-05-20 MED ORDER — METHOCARBAMOL 500 MG PO TABS: 500.0000 mg | ORAL_TABLET | Freq: Three times a day (TID) | ORAL | Status: DC | PRN

## 2014-05-20 MED ORDER — CLOPIDOGREL BISULFATE 75 MG PO TABS
75.0000 mg | ORAL_TABLET | Freq: Every day | ORAL | Status: DC
  Administered 2014-05-22: 75 mg via ORAL
  Filled 2014-05-20: qty 1

## 2014-05-20 MED ORDER — OXYCODONE HCL 5 MG PO TABS
ORAL_TABLET | ORAL | Status: AC
  Administered 2014-05-20: 5 mg via ORAL
  Filled 2014-05-20: qty 1

## 2014-05-20 MED ORDER — THROMBIN 20000 UNITS EX SOLR
CUTANEOUS | Status: DC | PRN
  Administered 2014-05-20: 20 mL via TOPICAL

## 2014-05-20 MED ORDER — ONDANSETRON HCL 4 MG/2ML IJ SOLN: 4.0000 mg | INTRAMUSCULAR | Status: DC | PRN

## 2014-05-20 MED ORDER — FENTANYL CITRATE (PF) 250 MCG/5ML IJ SOLN
INTRAMUSCULAR | Status: AC
  Filled 2014-05-20: qty 5

## 2014-05-20 MED ORDER — LIDOCAINE HCL (CARDIAC) 20 MG/ML IV SOLN
INTRAVENOUS | Status: DC | PRN
  Administered 2014-05-20: 50 mg via INTRAVENOUS

## 2014-05-20 MED ORDER — CEFAZOLIN SODIUM 1-5 GM-% IV SOLN
1.0000 g | Freq: Three times a day (TID) | INTRAVENOUS | Status: AC
  Administered 2014-05-20 – 2014-05-21 (×2): 1 g via INTRAVENOUS
  Filled 2014-05-20 (×2): qty 50

## 2014-05-20 MED ORDER — METHOCARBAMOL 1000 MG/10ML IJ SOLN
500.0000 mg | Freq: Four times a day (QID) | INTRAVENOUS | Status: DC | PRN
  Filled 2014-05-20: qty 5

## 2014-05-20 MED ORDER — PRAVASTATIN SODIUM 20 MG PO TABS
20.0000 mg | ORAL_TABLET | Freq: Every evening | ORAL | Status: DC
  Administered 2014-05-20 – 2014-05-22 (×3): 20 mg via ORAL
  Filled 2014-05-20 (×3): qty 1

## 2014-05-20 MED ORDER — METHOCARBAMOL 500 MG PO TABS
500.0000 mg | ORAL_TABLET | Freq: Four times a day (QID) | ORAL | Status: DC | PRN
  Administered 2014-05-20 – 2014-05-22 (×5): 500 mg via ORAL
  Filled 2014-05-20 (×6): qty 1

## 2014-05-20 MED ORDER — DEXAMETHASONE SODIUM PHOSPHATE 4 MG/ML IJ SOLN
4.0000 mg | Freq: Four times a day (QID) | INTRAMUSCULAR | Status: AC
  Administered 2014-05-21 (×3): 4 mg via INTRAVENOUS
  Filled 2014-05-20 (×3): qty 1

## 2014-05-20 MED ORDER — EPHEDRINE SULFATE 50 MG/ML IJ SOLN
INTRAMUSCULAR | Status: DC | PRN
  Administered 2014-05-20 (×2): 10 mg via INTRAVENOUS

## 2014-05-20 MED ORDER — OXYCODONE-ACETAMINOPHEN 10-325 MG PO TABS: 1.0000 | ORAL_TABLET | ORAL | Status: AC | PRN

## 2014-05-20 MED ORDER — DEXAMETHASONE 4 MG PO TABS
4.0000 mg | ORAL_TABLET | Freq: Four times a day (QID) | ORAL | Status: AC
  Administered 2014-05-20: 4 mg via ORAL
  Filled 2014-05-20 (×2): qty 1

## 2014-05-20 MED ORDER — SODIUM CHLORIDE 0.9 % IV SOLN: 250.0000 mL | INTRAVENOUS | Status: DC

## 2014-05-20 MED ORDER — ONDANSETRON HCL 4 MG/2ML IJ SOLN
INTRAMUSCULAR | Status: DC | PRN
  Administered 2014-05-20: 4 mg via INTRAVENOUS

## 2014-05-20 MED ORDER — HYDROMORPHONE HCL 1 MG/ML IJ SOLN
INTRAMUSCULAR | Status: AC
  Administered 2014-05-20: 0.5 mg via INTRAVENOUS
  Filled 2014-05-20: qty 2

## 2014-05-20 MED ORDER — METHOCARBAMOL 500 MG PO TABS
ORAL_TABLET | ORAL | Status: AC
  Filled 2014-05-20: qty 1

## 2014-05-20 MED ORDER — SODIUM CHLORIDE 0.9 % IJ SOLN: 3.0000 mL | INTRAMUSCULAR | Status: DC | PRN

## 2014-05-20 MED ORDER — PHENOL 1.4 % MT LIQD: 1.0000 | OROMUCOSAL | Status: DC | PRN

## 2014-05-20 MED ORDER — OXYCODONE HCL 5 MG PO TABS
10.0000 mg | ORAL_TABLET | ORAL | Status: DC | PRN
  Administered 2014-05-20 – 2014-05-22 (×5): 10 mg via ORAL
  Filled 2014-05-20 (×5): qty 2

## 2014-05-20 MED ORDER — ROCURONIUM BROMIDE 100 MG/10ML IV SOLN
INTRAVENOUS | Status: DC | PRN
  Administered 2014-05-20: 10 mg via INTRAVENOUS
  Administered 2014-05-20: 20 mg via INTRAVENOUS
  Administered 2014-05-20: 10 mg via INTRAVENOUS
  Administered 2014-05-20: 50 mg via INTRAVENOUS

## 2014-05-20 MED ORDER — ONDANSETRON HCL 4 MG PO TABS: 4.0000 mg | ORAL_TABLET | Freq: Three times a day (TID) | ORAL | Status: DC | PRN

## 2014-05-20 MED ORDER — PNEUMOCOCCAL VAC POLYVALENT 25 MCG/0.5ML IJ INJ
0.5000 mL | INJECTION | INTRAMUSCULAR | Status: DC
  Filled 2014-05-20: qty 0.5

## 2014-05-20 MED ORDER — OXYCODONE HCL 5 MG/5ML PO SOLN: 5.0000 mg | Freq: Once | ORAL | Status: AC | PRN

## 2014-05-20 MED ORDER — GLYCOPYRROLATE 0.2 MG/ML IJ SOLN
INTRAMUSCULAR | Status: AC
  Filled 2014-05-20: qty 2

## 2014-05-20 MED ORDER — NITROGLYCERIN 0.4 MG SL SUBL: 0.4000 mg | SUBLINGUAL_TABLET | SUBLINGUAL | Status: DC | PRN

## 2014-05-20 MED ORDER — BUDESONIDE-FORMOTEROL FUMARATE 160-4.5 MCG/ACT IN AERO
2.0000 | INHALATION_SPRAY | Freq: Two times a day (BID) | RESPIRATORY_TRACT | Status: DC
  Administered 2014-05-21 – 2014-05-22 (×4): 2 via RESPIRATORY_TRACT
  Filled 2014-05-20 (×2): qty 6

## 2014-05-20 MED ORDER — THROMBIN 20000 UNITS EX SOLR
20000.0000 [IU] | CUTANEOUS | Status: DC
  Filled 2014-05-20 (×2): qty 20000

## 2014-05-20 SURGICAL SUPPLY — 62 items
BLADE SURG ROTATE 9660 (MISCELLANEOUS) IMPLANT
BUR EGG ELITE 4.0 (BURR) ×2 IMPLANT
BUR EGG ELITE 4.0MM (BURR) ×1
CLOSURE STERI-STRIP 1/2X4 (GAUZE/BANDAGES/DRESSINGS) ×1
CLSR STERI-STRIP ANTIMIC 1/2X4 (GAUZE/BANDAGES/DRESSINGS) ×2 IMPLANT
COVER MAYO STAND STRL (DRAPES) IMPLANT
COVER SURGICAL LIGHT HANDLE (MISCELLANEOUS) ×3 IMPLANT
DRAPE C-ARM 42X72 X-RAY (DRAPES) ×3 IMPLANT
DRAPE C-ARMOR (DRAPES) ×3 IMPLANT
DRAPE POUCH INSTRU U-SHP 10X18 (DRAPES) ×3 IMPLANT
DRAPE SURG 17X23 STRL (DRAPES) ×3 IMPLANT
DRAPE U-SHAPE 47X51 STRL (DRAPES) ×3 IMPLANT
DRSG MEPILEX BORDER 4X8 (GAUZE/BANDAGES/DRESSINGS) ×3 IMPLANT
DURAPREP 26ML APPLICATOR (WOUND CARE) ×3 IMPLANT
ELECT BLADE 4.0 EZ CLEAN MEGAD (MISCELLANEOUS) ×3
ELECT BLADE 6.5 EXT (BLADE) ×3 IMPLANT
ELECT CAUTERY BLADE 6.4 (BLADE) ×3 IMPLANT
ELECT PENCIL ROCKER SW 15FT (MISCELLANEOUS) ×3 IMPLANT
ELECT REM PT RETURN 9FT ADLT (ELECTROSURGICAL) ×3
ELECTRODE BLDE 4.0 EZ CLN MEGD (MISCELLANEOUS) ×1 IMPLANT
ELECTRODE REM PT RTRN 9FT ADLT (ELECTROSURGICAL) ×1 IMPLANT
GLOVE BIOGEL PI IND STRL 8.5 (GLOVE) ×1 IMPLANT
GLOVE BIOGEL PI INDICATOR 8.5 (GLOVE) ×2
GLOVE SS BIOGEL STRL SZ 8.5 (GLOVE) ×1 IMPLANT
GLOVE SUPERSENSE BIOGEL SZ 8.5 (GLOVE) ×2
GOWN STRL REUS W/ TWL LRG LVL3 (GOWN DISPOSABLE) ×1 IMPLANT
GOWN STRL REUS W/TWL 2XL LVL3 (GOWN DISPOSABLE) ×6 IMPLANT
GOWN STRL REUS W/TWL LRG LVL3 (GOWN DISPOSABLE) ×2
GUIDEWIRE NITINOL BEVEL TIP (WIRE) ×3 IMPLANT
KIT BASIN OR (CUSTOM PROCEDURE TRAY) ×3 IMPLANT
KIT POSITION SURG JACKSON T1 (MISCELLANEOUS) ×3 IMPLANT
KIT ROOM TURNOVER OR (KITS) ×3 IMPLANT
MIX DBX 10CC 35% BONE (Bone Implant) ×3 IMPLANT
NEEDLE 22X1 1/2 (OR ONLY) (NEEDLE) ×3 IMPLANT
NEEDLE SPNL 18GX3.5 QUINCKE PK (NEEDLE) ×3 IMPLANT
NS IRRIG 1000ML POUR BTL (IV SOLUTION) ×3 IMPLANT
PACK LAMINECTOMY ORTHO (CUSTOM PROCEDURE TRAY) ×3 IMPLANT
PACK UNIVERSAL I (CUSTOM PROCEDURE TRAY) ×3 IMPLANT
PAD ARMBOARD 7.5X6 YLW CONV (MISCELLANEOUS) ×6 IMPLANT
PATTIES SURGICAL .5 X.5 (GAUZE/BANDAGES/DRESSINGS) IMPLANT
PATTIES SURGICAL .5 X1 (DISPOSABLE) ×3 IMPLANT
POSITIONER HEAD PRONE TRACH (MISCELLANEOUS) ×3 IMPLANT
ROD RELINE TI MAS LD 5.5X65 (Rod) ×3 IMPLANT
SCREW LOCK RELINE 5.5 TULIP (Screw) ×9 IMPLANT
SCREW RELINE MAS POLY 7.5X45MM (Screw) ×9 IMPLANT
SPONGE LAP 4X18 X RAY DECT (DISPOSABLE) ×6 IMPLANT
SPONGE SURGIFOAM ABS GEL 100 (HEMOSTASIS) ×3 IMPLANT
SURGIFLO TRUKIT (HEMOSTASIS) ×3 IMPLANT
SUT BONE WAX W31G (SUTURE) ×3 IMPLANT
SUT MNCRL AB 3-0 PS2 18 (SUTURE) ×6 IMPLANT
SUT VIC AB 1 CT1 18XCR BRD 8 (SUTURE) ×3 IMPLANT
SUT VIC AB 1 CT1 8-18 (SUTURE) ×6
SUT VIC AB 1 CTX 36 (SUTURE) ×4
SUT VIC AB 1 CTX36XBRD ANBCTR (SUTURE) ×2 IMPLANT
SUT VIC AB 2-0 CT1 18 (SUTURE) ×3 IMPLANT
SYR BULB IRRIGATION 50ML (SYRINGE) ×3 IMPLANT
SYR CONTROL 10ML LL (SYRINGE) ×3 IMPLANT
TOWEL OR 17X24 6PK STRL BLUE (TOWEL DISPOSABLE) ×3 IMPLANT
TOWEL OR 17X26 10 PK STRL BLUE (TOWEL DISPOSABLE) ×3 IMPLANT
TRAY FOLEY CATH 16FRSI W/METER (SET/KITS/TRAYS/PACK) ×3 IMPLANT
WATER STERILE IRR 1000ML POUR (IV SOLUTION) ×3 IMPLANT
YANKAUER SUCT BULB TIP NO VENT (SUCTIONS) ×3 IMPLANT

## 2014-05-20 NOTE — Transfer of Care (Signed)
Immediate Anesthesia Transfer of Care Note  Patient: [name]  Procedure(s) Performed: Procedure(s): REVISION LUMBAR FUSION L4-S1        (2 LEVEL)  (N/A) ILIAC CREST BONE GRAFT HARVEST (N/A)  Patient Location: PACU  Anesthesia Type:General  Level of Consciousness: awake, alert  and patient cooperative  Airway & Oxygen Therapy: Patient Spontanous Breathing and Patient connected to nasal cannula oxygen  Post-op Assessment: Report given to RN and Post -op Vital signs reviewed and stable  Post vital signs: Reviewed and stable  Last Vitals:  Filed Vitals:   05/20/14 0632  BP: 147/76  Pulse:   Temp:   Resp:     Complications: No apparent anesthesia complications

## 2014-05-20 NOTE — Progress Notes (Signed)
Lunch relief by M. Lovena Le  RN

## 2014-05-20 NOTE — Op Note (Signed)
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  PROCEDURES: 1. Left iliac crest bone graft harvest. 2. Removal of L4, L5, S1 pedicle screws and reimplantation of larger     pedicle screws with posterior and posterolateral bone graft     placement.  COMPLICATIONS:  None.  CONDITION:  Stable.  HISTORY:  This is a very pleasant 64 year old gentleman who 6 years ago underwent a 2-level TLIF.  Postoperatively, he did well and then ultimately discharged from my practice, he returned about 5 years out from his surgery complaining of severe back pain without any significant injury.  Ultimately, CT myelogram was done which showed no significant neural compression, but he did have loosening of the L4 screw with no convincing bridging bone at that L4-5 level and similarly at the L5-S1. Because of the ongoing back pain, I felt pseudoarthrosis was the primary issue.  After discussing treatment options, he elected to proceed with surgery.  Initially, we had him on the schedule and then he had to cancel and he returned to see me because he wished to proceed with the surgery.  All appropriate risks, benefits, and alternatives were discussed with the patient and consent was obtained.  OPERATIVE NOTE:  The patient was brought to the operating room, placed supine on the operating table.  After successful induction of general anesthesia and endotracheal intubation, TEDs, SCDs, and Foley  were inserted.  The patient was turned prone onto the spine frame and all bony prominences were well padded.  The back was prepped and draped in a standard fashion.  Time-out was taken to confirm patient, procedure, and all other pertinent important data.  Once this was completed, I then identified the previous small MIS left-sided Wiltse incision that was made and infiltrated this area with 0.25% Marcaine.  I used approximately 20 mL in and around the area and over the iliac crest bone graft site to aid in postoperative pain control.  I extended the incision proximally and distally.  I then sharply dissected down to the deep fascia.  I then dissected in the deep subcutaneous tissue above the fascia over to the lateral aspect of the iliac crest.  Then, I then dissected medially towards the midline.  I then near the midline of the spine, incised the fascia and then began stripping the paraspinal muscles out using a Cobb and Bovie.  I exposed the left side spinous process and lamina of L4, L5, and S1.  I then continued the dissection until I exposed the L4-5 and L5-S1 facet capsules.  The L3-4 facet capsule was kept intact.  I used a Bovie to completely remove the facet capsule itself and exposed the L4-5 and L5-S1 facet joints.  I then repositioned my retractor, removed that retractor, and then bluntly dissected through the lateral side of the paraspinal muscles to expose the pedicle screw construct.  Once the pedicle screws were exposed, I removed the locking nut with a rod and then sequentially removed the pedicle screws from L4, L5, and S1.  I then placed guide pins down the pedicles to keep the holes marked.  At this point, I then used my Cobb elevator to expose the L4 and L5 transverse process and a portion of the sacral ala.  Hemostasis was obtained using bipolar electrocautery.  With the posterolateral gutter exposed and the pedicle screws exposed, I then tapped with a 7.0 screw to  a depth of 45 down each screw hole, freshened the screw holes as I was going to put a larger screw in.  With the screws tapped and the transverse process was exposed, I then used a high- speed bur to decorticate the lateral side of the facet complex and the transverse process.  This was done at the L4 transverse process, the L5, and the sacral ala.  With the posterolateral gutter now prepped for the bone graft, I then removed the retractor and then palpated the posterior iliac crest.  I then incised the fascia over the posterior iliac crest and dissected along the inner and outer table approximately 1 cm in depth so I had it completely exposed.  I then used an osteotome to create and lifted off a cap of the cortical bone.  I then used a series of curettes and gouges to harvest a significant amount of the iliac crest bone graft.  This was a good red marrow.  There was no significant fatty infiltrated marrow.  Once I had an adequate amount of bone graft, I irrigated the iliac crest site copiously with normal saline and then packed the resulting bony defect with thrombin-soaked Gelfoam patty.  I then placed the cortical cap back over and then sutured the deep fascia closed with interrupted #1 Vicryl sutures.  I then went back to the spinous and then placed the majority of the bone graft in the posterolateral gutter.  With bone graft completely in place, I then placed my 7.0, 45 mm length screws into the pedicles themselves.  Each screw had excellent purchase.  Once all the rod and screws were down, I then measured and placed the appropriate size rod.  I then applied the top nuts and then locked them into place and torqued them off according to the manufacturer's standard.  I then exposed the lamina and spinous process again and using my high-speed cortical bur, decorticated the medial side of the facet complex at L4-5 and L5-S1 as well as the spinous process and lamina.  I then placed the  remaining portion of bone graft along with 10 mL of DBX mix in the posterior aspect of the spine to aid in the overall fusion rate.  At this point in time, I then closed the spine in a layered fashion with interrupted #1 Vicryl sutures, superficial with 2-0 Vicryl sutures, and a 3-0 Monocryl for the skin.  Steri-Strips and a dry dressing were applied.  The patient was ultimately extubated, transferred to PACU without incident.  At the end of the case, all needle and sponge counts were correct.  There were no adverse intraoperative events.     Dahlia Bailiff, MD     DDB/MEDQ  D:  05/20/2014  T:  05/20/2014  Job:  [id number]

## 2014-05-20 NOTE — Anesthesia Postprocedure Evaluation (Signed)
Anesthesia Post Note  Patient: [name]  Procedure(s) Performed: Procedure(s) (LRB): REVISION LUMBAR FUSION L4-S1        (2 LEVEL)  (N/A) ILIAC CREST BONE GRAFT HARVEST (N/A)  Anesthesia type: General  Patient location: PACU  Post pain: Pain level controlled and Adequate analgesia  Post assessment: Post-op Vital signs reviewed, Patient's Cardiovascular Status Stable, Respiratory Function Stable, Patent Airway and Pain level controlled  Last Vitals:  Filed Vitals:   05/20/14 1129  BP:   Pulse: 79  Temp:   Resp: 13    Post vital signs: Reviewed and stable  Level of consciousness: awake, alert  and oriented  Complications: No apparent anesthesia complications

## 2014-05-20 NOTE — H&P (Signed)
History of Present Illness The patient is a 64 year old male who comes in today for a preoperative History and Physical. The patient is scheduled for a revision fusion L4-S1, ICBG harvest to be performed by Dr. Duane Lope D. Rolena Infante, MD at [facility] on 05-20-14 . Please see the hospital record for complete dictated history and physical.  Allergies No Known Drug Allergies08/28/2012  Family History Depression mother, father and sister Cancer mother, father and sister Congestive Heart Failure mother, sister and grandmother mothers side Diabetes Mellitus mother and sister Heart Disease mother, father, sister, grandmother mothers side, grandfather mothers side and grandfather fathers side Heart disease in male family member before age 61 Hypertension mother, father, sister, grandmother mothers side, grandfather mothers side and grandfather fathers side Rheumatoid Arthritis mother, father, sister, grandmother mothers side, grandfather mothers side, grandmother fathers side and grandfather fathers side Osteoporosis grandfather fathers side Liver Disease, Chronic father  Social History Tobacco / smoke exposure no Drug/Alcohol Rehab (Previously) no No alcohol use Alcohol use current drinker; drinks beer; only occasionally per week Current work status disabled Drug/Alcohol Rehab (Currently) no Children 2 Exercise Exercises daily; does running / walking Illicit drug use no Living situation live alone Marital status divorced Pain Contract yes Tobacco use Former smoker, Recently quit tobacco use.  Medication History  Ferrex 150 Plus (80-70-50MG  Capsule, Oral) Active. AmLODIPine Besylate (10MG  Tablet, Oral) Active. Nitrostat (0.4MG  Tab Sublingual, Sublingual) Active. Symbicort (160-4.5MCG/ACT Aerosol, Inhalation) Active. Hydrocodone-Acetaminophen (10-325MG  Tablet, Oral) Active. Omeprazole (20MG  Capsule ER, Oral) Active. ALPRAZolam (1MG  Tablet, Oral)  Active. Metoprolol Tartrate (25MG  Tablet, 1 Oral two times daily) Active. (bid) Pravastatin Sodium (Oral) Specific dose unknown - Active. (qd) Clopidogrel Bisulfate (75MG  Tablet, 1 Oral daily) Active. (qd (plavix)) Tamsulosin HCl (0.4MG  Capsule ER, Oral) Active. Aspirin EC (81MG  Tablet DR, 1 Oral daily) Active. (qd) Iron Polysacch Cmplx-B12-FA (150-0.025-1MG  Tablet, Oral) Active. Folic Acid (706CBJ Tablet, Oral) Active. Co Q 10 (100MG  Capsule, Oral) Active. Xanax (1MG  Tablet, Oral) Active. Medications Reconciled  Past Surgical History  Anal Fissure Repair Hemorrhoidectomy Appendectomy Leg Circulation Surgery right Spinal Surgery Coronary Artery Bypass Graft 4 or more vessels Colon Polyp Removal - Open Colon Polyp Removal - Colonoscopy Spinal Fusion lower back Tonsillectomy  Other Problems  Anemia Emphysema Of Lung Coronary artery disease Anxiety Disorder High blood pressure Rheumatoid Arthritis Myocardial infarction Hypercholesterolemia  Vitals  05/18/2014 1:51 PM Weight: 165.03 lb Height: 68in Body Surface Area: 1.88 m Body Mass Index: 25.09 kg/m  Temp.: 98.70F(Oral)  Pulse: 55 (Regular)  BP: 143/76 (Sitting, Left Arm, Standard)  Objective Transcription CLINICAL EXAM He is a pleasant gentleman, appears to be stated age, in no acute distress. He is alert. He is oriented x3. He still has terrific back pain with range of motion and palpation. A well healed surgical scar. The pain will eventually radiate into the legs, but it all starts in the back and the legs, it is not kind of a problem until the back pain has gotten severe. No loss of bowel and bladder control. Abdomen is soft and nontender. Lungs are clear to auscultation. Heart regular rate and rhythm. No focal motor deficits in the lower extremity. No sensory deficits. Negative straight leg raise test. 1+ deep tendon reflexes. Normal gait pattern except for the slight limp due  to his back pain.  RADIOGRAPHS X-rays today in my office demonstrate no hardware complication with questionable lysis around the L4 and L5 pedicle screws and questionable interbody fusion especially at the L4-L5 level.   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 Based on his previous CT myelogram, there is no evidence of neurocompression, but there is evidence of a persistent pseudarthrosis. Given that, I have elected to proceed with exchanging the pedicle screws for larger screws and putting iliac crest bone graft down to help the fusion to take. I have explained the risks, which include infection, bleeding, nerve damage, death, stroke, paralysis, failure to heel, ongoing or worst pain, losing bowel and bladder control, need for further surgery, and especially hip pain from where the bone graft is taken from. He is present for the dictation. He expresses an understanding of this.  We have also talked about the fact that he was recently attained for a DWI. At this point, I told him that since we are going through the surgery, I do not think it is advisable that he be incarcerated as this would increase his risk of complications such as infection, blood clots, and hardware failure. My hope is that over the next six to nine months that fusion will take. He will have a solid arthrodesis. He will be decreased micromotion, decreased inflammation which would then turn to less pain and improved quality of life. He and his daughter are present for the dictation. They both expressed an understanding of the risks, benefits, and the reasons for surgery. We will plan on the surgery on the 21st.

## 2014-05-20 NOTE — Brief Op Note (Signed)
05/20/2014  10:45 AM  PATIENT:  [name]  64 y.o. male  PRE-OPERATIVE DIAGNOSIS:  NON UNION L4-S1  POST-OPERATIVE DIAGNOSIS:  NON UNION L4-S1  PROCEDURE:  Procedure(s): REVISION LUMBAR FUSION L4-S1        (2 LEVEL)  (N/A) ILIAC CREST BONE GRAFT HARVEST (N/A)  SURGEON:  Surgeon(s) and Role:    * Melina Schools, MD - Primary  PHYSICIAN ASSISTANT:   ASSISTANTS: none   ANESTHESIA:   general  EBL:  Total I/O In: 2000 [I.V.:2000] Out: 600 [Urine:200; Blood:400]  BLOOD ADMINISTERED:none  DRAINS: none   LOCAL MEDICATIONS USED:  MARCAINE     SPECIMEN:  No Specimen  DISPOSITION OF SPECIMEN:  N/A  COUNTS:  YES  TOURNIQUET:  * No tourniquets in log *  DICTATION: .Other Dictation: Dictation Number [id number]  PLAN OF CARE: Admit for overnight observation  PATIENT DISPOSITION:  PACU - hemodynamically stable.

## 2014-05-20 NOTE — Anesthesia Preprocedure Evaluation (Signed)
Anesthesia Evaluation  Patient identified by MRN, date of birth, ID band Patient awake    Reviewed: Allergy & Precautions, NPO status , Patient's Chart, lab work & pertinent test results  Airway Mallampati: II   Neck ROM: full    Dental   Pulmonary COPDCurrent Smoker,  breath sounds clear to auscultation        Cardiovascular hypertension, + CAD, + Past MI, + CABG and + Peripheral Vascular Disease Rhythm:regular Rate:Normal  Cleared by his cardiologist Johnsie Cancel).   Neuro/Psych Anxiety    GI/Hepatic PUD, GERD-  ,  Endo/Other    Renal/GU      Musculoskeletal  (+) Arthritis -,   Abdominal   Peds  Hematology   Anesthesia Other Findings   Reproductive/Obstetrics                             Anesthesia Physical Anesthesia Plan  ASA: III  Anesthesia Plan: General   Post-op Pain Management:    Induction: Intravenous  Airway Management Planned: Oral ETT  Additional Equipment:   Intra-op Plan:   Post-operative Plan: Extubation in OR  Informed Consent: I have reviewed the patients History and Physical, chart, labs and discussed the procedure including the risks, benefits and alternatives for the proposed anesthesia with the patient or authorized representative who has indicated his/her understanding and acceptance.     Plan Discussed with: CRNA, Anesthesiologist and Surgeon  Anesthesia Plan Comments:         Anesthesia Quick Evaluation

## 2014-05-20 NOTE — Anesthesia Procedure Notes (Signed)
Procedure Name: Intubation Performed by: Gean Maidens Pre-anesthesia Checklist: Patient identified, Emergency Drugs available, Suction available, Patient being monitored and Timeout performed Patient Re-evaluated:Patient Re-evaluated prior to inductionOxygen Delivery Method: Circle system utilized Preoxygenation: Pre-oxygenation with 100% oxygen Intubation Type: IV induction Ventilation: Mask ventilation without difficulty Laryngoscope Size: Mac and 3 Grade View: Grade I Tube type: Oral Tube size: 7.5 mm Number of attempts: 1 Placement Confirmation: ETT inserted through vocal cords under direct vision,  breath sounds checked- equal and bilateral,  positive ETCO2 and CO2 detector Secured at: 23 cm Tube secured with: Tape Dental Injury: Teeth and Oropharynx as per pre-operative assessment

## 2014-05-21 ENCOUNTER — Encounter (HOSPITAL_COMMUNITY): Payer: Self-pay | Admitting: Orthopedic Surgery

## 2014-05-21 MED ORDER — OXYCODONE HCL ER 15 MG PO T12A
15.0000 mg | EXTENDED_RELEASE_TABLET | Freq: Two times a day (BID) | ORAL | Status: DC
  Administered 2014-05-21 – 2014-05-22 (×3): 15 mg via ORAL
  Filled 2014-05-21 (×3): qty 1

## 2014-05-21 NOTE — Care Management Note (Signed)
CARE MANAGEMENT NOTE 05/21/2014  Patient:  [name], [name]   Account Number:  [id number]  Date Initiated:  05/21/2014  Documentation initiated by:  [name]  Subjective/Objective Assessment:   64 yr old male admitted with onunion of L4-S1. Patient underwent a revision of Lumbar fusion, Ld-S1.     Action/Plan:   Case manager spoke with patient concerning home health needs and DME needs. Choice offered. Referral called to [facility]. D/T patient being on medicaid he will not receive PT, may have a nurse visit.   Anticipated DC Date:  05/22/2014   Anticipated DC Plan:  [place] [facility]  CM consult      [facility] Choice  HOME HEALTH  DURABLE MEDICAL EQUIPMENT   Choice offered to / List presented to:  [name]   DME arranged  3-N-1  Vassie Moselle      DME agency  [place] arranged  [place] RN      [place].   Status of service:  Completed, signed off Medicare Important Message given?   (If response is "NO", the following Medicare IM given date fields will be blank) Date Medicare IM given:   Medicare IM given by:   Date Additional Medicare IM given:   Additional Medicare IM given by:    Discharge Disposition:  [place]  Per UR Regulation:  Reviewed for med. necessity/level of care/duration of stay  If discussed at [place] of Stay Meetings, dates discussed:
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
## 2014-05-21 NOTE — Progress Notes (Signed)
Utilization review completed.  

## 2014-05-21 NOTE — Progress Notes (Addendum)
    Subjective: Procedure(s) (LRB): REVISION LUMBAR FUSION L4-S1        (2 LEVEL)  (N/A) ILIAC CREST BONE GRAFT HARVEST (N/A) 1 Day Post-Op  Patient reports pain as 5 on 0-10 scale.  Reports unchanged leg pain reports incisional back pain   Positive void Negative bowel movement Positive flatus Negative chest pain or shortness of breath  Objective: Vital signs in last 24 hours: Temp:  [97.7 F (36.5 C)-98.5 F (36.9 C)] 97.8 F (36.6 C) (04/22 0533) Pulse Rate:  [50-83] 51 (04/22 0533) Resp:  [9-18] 17 (04/22 0533) BP: (116-150)/(56-85) 140/73 mmHg (04/22 0533) SpO2:  [93 %-100 %] 96 % (04/22 0533) Weight:  [70.308 kg (155 lb)] 70.308 kg (155 lb) (04/21 1449)  Intake/Output from previous day: 04/21 0701 - 04/22 0700 In: 3390 [P.O.:690; I.V.:2400; IV Piggyback:300] Out: 4700 [Urine:4300; Blood:400]  Labs:  Recent Labs  05/18/14 1306  WBC 11.0*  RBC 5.23  HCT 46.0  PLT 279    Recent Labs  05/18/14 1306  NA 138  K 4.4  CL 104  CO2 25  BUN 7  CREATININE 0.80  GLUCOSE 104*  CALCIUM 10.0   No results for input(s): LABPT, INR in the last 72 hours.  Physical Exam: Neurologically intact Neurovascular intact Intact pulses distally Dorsiflexion/Plantar flexion intact Incision: dressing C/D/I and no drainage Compartment soft  Assessment/Plan: Patient stable  xrays n/a Continue mobilization with physical therapy Continue care  Advance diet Up with therapy D/C IV fluids  Restart xanax Mobilization - possible d/c over there weekend depending upon pain control Will add Oxycontin 12 hr pain control while in hospital for better control plavix and ASA - restart in AM  Melina Schools, MD [place] [phone]

## 2014-05-21 NOTE — Progress Notes (Signed)
Physical Therapy Treatment Patient Details Name: [name] MRN: [id number] DOB: 02-Dec-1950 Today's Date: 05/21/2014    History of Present Illness 64 yo M s/p REVISION LUMBAR FUSION L4-S1. PMH includes RA, back surgery 2010, CAD, anemia, hypertenstion, anxiety disorder.     PT Comments    Able to walk entire unit without difficulty.  Safe use of walker in hall, but does not tend to use it in room; still needing cues for precautions in sitting due to twisting.  Will need HHPT and potentially HHaide at d/c if unable to get aide set up through process he has already started.  Follow Up Recommendations  Home health PT;Supervision - Intermittent     Equipment Recommendations  Rolling walker with 5" wheels    Recommendations for Other Services       Precautions / Restrictions Precautions Precautions: Back;Fall Precaution Comments: Reviewed back precautions and handout Required Braces or Orthoses: Spinal Brace Spinal Brace: Applied in sitting position;Lumbar corset    Mobility  Bed Mobility Overal bed mobility: Needs Assistance Bed Mobility: Sidelying to Sit;Rolling;Sit to Sidelying Rolling: Supervision Sidelying to sit: Supervision;HOB elevated     Sit to sidelying: Min guard General bed mobility comments: back to supine with bed flat and no rails and cues/minguard for safety  Transfers Overall transfer level: Needs assistance Equipment used: None Transfers: Sit to/from Stand Sit to Stand: Supervision         General transfer comment: stands without walker, UE support on bed, cues for precautions; donned brace in standing with supervision min cues  Ambulation/Gait Ambulation/Gait assistance: Supervision Ambulation Distance (Feet): 500 Feet Assistive device: Rolling walker (2 wheeled) Gait Pattern/deviations: Step-through pattern     General Gait Details: happy to finally have brace and eager to walk; no loss of balance or cues needed, supervision for  safety   Stairs            Wheelchair Mobility    Modified Rankin (Stroke Patients Only)       Balance Overall balance assessment: Needs assistance         Standing balance support: No upper extremity supported Standing balance-Leahy Scale: Good Standing balance comment: washed hands at sink no loss of balance or UE support; in room ambulation no device, no loss of balance; still cues for precautions sitting edge of bed                    Cognition Arousal/Alertness: Awake/alert Behavior During Therapy: WFL for tasks assessed/performed Overall Cognitive Status: Within Functional Limits for tasks assessed                      Exercises      General Comments General comments (skin integrity, edema, etc.): educated in precautions and discussed need for brace; RN aware      Pertinent Vitals/Pain Pain Score: 7  Pain Location: back Pain Intervention(s): Monitored during session    Home Living                      Prior Function            PT Goals (current goals can now be found in the care plan section) Acute Rehab PT Goals Patient Stated Goal: to walk Time For Goal Achievement: 05/26/14 Potential to Achieve Goals: Good Progress towards PT goals: Progressing toward goals    Frequency  Min 6X/week    PT Plan Current plan remains appropriate    Co-evaluation  End of Session Equipment Utilized During Treatment: Back brace Activity Tolerance: Patient tolerated treatment well Patient left: in bed;with call bell/phone within reach     Time: 1445-1505 PT Time Calculation (min) (ACUTE ONLY): 20 min  Charges:  $Gait Training: 8-22 mins $Therapeutic Activity: 8-22 mins                    G Codes:      WYNN,CYNDI 05/25/2014, 3:43 PM Magda Kiel, [place] 05-25-14
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
## 2014-05-21 NOTE — Plan of Care (Signed)
Problem: Consults Goal: Diagnosis - Spinal Surgery Outcome: Completed/Met Date Met:  05/21/14 Thoraco/Lumbar Spine Fusion revision lumbar fusion L4-S1

## 2014-05-22 MED ORDER — MAGNESIUM CITRATE PO SOLN
0.5000 | Freq: Once | ORAL | Status: AC
  Administered 2014-05-22: 0.5 via ORAL

## 2014-05-22 MED ORDER — FLEET ENEMA 7-19 GM/118ML RE ENEM
1.0000 | ENEMA | Freq: Once | RECTAL | Status: DC
  Filled 2014-05-22: qty 1

## 2014-05-22 MED ORDER — MAGNESIUM CITRATE PO SOLN
0.5000 | Freq: Once | ORAL | Status: DC
  Filled 2014-05-22: qty 296
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
## 2014-05-22 NOTE — Progress Notes (Signed)
Fleets enema administered around 1900. Large amount stool expelled around 1910. Stated felt much better.

## 2014-05-22 NOTE — Progress Notes (Signed)
Occupational Therapy Treatment Patient Details Name: [name] MRN: [id number] DOB: 06/05/1950 Today's Date: 05/22/2014    History of present illness 64 yo M s/p REVISION LUMBAR FUSION L4-S1. PMH includes RA, back surgery 2010, CAD, anemia, hypertenstion, anxiety disorder.    OT comments  Pt making progress excellent progress with functional goals. Pt able to recall all back precautions. donn and doff back brace with sup and maintain back precautions during ADLs and ADL mobility. Pt scheduled to d/c home today  Follow Up Recommendations  No OT follow up;Supervision - Intermittent    Equipment Recommendations  3 in 1 bedside comode    Recommendations for Other Services      Precautions / Restrictions Precautions Precautions: Back;Fall Precaution Comments: able to recall 3/3 back precautions Required Braces or Orthoses: Spinal Brace Spinal Brace: Applied in sitting position;Lumbar corset Restrictions Weight Bearing Restrictions: No       Mobility Bed Mobility Overal bed mobility: Needs Assistance Bed Mobility: Sidelying to Sit;Rolling;Sit to Sidelying;Supine to Sit;Sit to Supine Rolling: Supervision Sidelying to sit: Supervision;HOB elevated Supine to sit: Supervision Sit to supine: Supervision Sit to sidelying: Min guard General bed mobility comments: Pt up with nursing before, in chair after.  Transfers Overall transfer level: Needs assistance Equipment used: None Transfers: Sit to/from Stand Sit to Stand: Supervision         General transfer comment: Supervision for safety. Cues for precautions and hand placement.    Balance Overall balance assessment: No apparent balance deficits (not formally assessed)                                 ADL                           Toilet Transfer: Supervision/safety;Ambulation       Tub/ Shower Transfer: Supervision/safety;Ambulation   Functional mobility during ADLs:  Supervision/safety General ADL Comments: pt able to donn/doff back brace with set up and sup. Pt able to maintian back precautions during ADL mobility and verbalize LB bathing and dressing techniques using A/E      Vision  no change from baseline                   Perception Perception Perception Tested?: No   Praxis Praxis Praxis tested?: Not tested    Cognition   Behavior During Therapy: [facility] for tasks assessed/performed Overall Cognitive Status: Within Functional Limits for tasks assessed                       Extremity/Trunk Assessment   WFL                        General Comments  pt very pleasant and cooperative    Pertinent Vitals/ Pain       Pain Assessment: 0-10 Pain Score: 3  Pain Location: back Pain Descriptors / Indicators: Sore Pain Intervention(s): Monitored during session  Home Living  home alone                                        Prior Functioning/Environment  independent           Frequency Min 2X/week     Progress Toward Goals  OT Goals(current goals can  now be found in the care plan section)  Progress towards OT goals: Progressing toward goals  Acute Rehab OT Goals Patient Stated Goal: to walk  Plan Discharge plan remains appropriate                     End of Session     Activity Tolerance Patient tolerated treatment well   Patient Left in bed;with call bell/phone within reach   Nurse Communication          Time: 5732-2025 OT Time Calculation (min): 16 min  Charges: OT General Charges $OT Visit: 1 Procedure OT Treatments $Therapeutic Activity: 8-22 mins  [name] 05/22/2014, 1:42 PM

## 2014-05-22 NOTE — Progress Notes (Signed)
Physical Therapy Treatment Patient Details Name: [name] MRN: [id number] DOB: 07-29-1950 Today's Date: 05/22/2014    History of Present Illness 64 yo M s/p REVISION LUMBAR FUSION L4-S1. PMH includes RA, back surgery 2010, CAD, anemia, hypertenstion, anxiety disorder.     PT Comments    Pt able to significantly progress ambulation this AM and adhere to back precautions. He is making progress towards all goals and increasing functional independence. Pt safe to D/C from a mobility standpoint based on progression towards goals set on PT eval. Plans to D/C later today.  Follow Up Recommendations  Home health PT;Supervision - Intermittent     Equipment Recommendations  Rolling walker with 5" wheels    Recommendations for Other Services       Precautions / Restrictions Precautions Precautions: Back;Fall Precaution Comments: Reviewed back precautions. Able to recall no twisting. Min A to recall no bending and arching. Restrictions Weight Bearing Restrictions: No    Mobility  Bed Mobility               General bed mobility comments: Pt up with nursing before, in chair after.  Transfers Overall transfer level: Needs assistance Equipment used: None Transfers: Sit to/from Stand Sit to Stand: Supervision         General transfer comment: Supervision for safety. Cues for precautions and hand placement.  Ambulation/Gait Ambulation/Gait assistance: Modified independent (Device/Increase time) Ambulation Distance (Feet): 1000 Feet Assistive device: Rolling walker (2 wheeled);None Gait Pattern/deviations: Step-through pattern   Gait velocity interpretation: Below normal speed for age/gender General Gait Details: No LOB or cues necessary. Able to progress from RW to no assistive device.   Stairs            Wheelchair Mobility    Modified Rankin (Stroke Patients Only)       Balance                                    Cognition  Arousal/Alertness: Awake/alert Behavior During Therapy: WFL for tasks assessed/performed Overall Cognitive Status: Within Functional Limits for tasks assessed                      Exercises      General Comments        Pertinent Vitals/Pain Pain Assessment: 0-10 Pain Score: 7  Pain Location: back Pain Descriptors / Indicators: Sore Pain Intervention(s): Monitored during session    Home Living                      Prior Function            PT Goals (current goals can now be found in the care plan section) Acute Rehab PT Goals Patient Stated Goal: to walk Time For Goal Achievement: 05/26/14 Potential to Achieve Goals: Good Progress towards PT goals: Progressing toward goals    Frequency  Min 6X/week    PT Plan Current plan remains appropriate    Co-evaluation             End of Session   Activity Tolerance: Patient tolerated treatment well Patient left: in chair;with call bell/phone within reach     Time: 0922-0940 PT Time Calculation (min) (ACUTE ONLY): 18 min  Charges:                       G Codes:  Rubye Oaks, [place] 05/22/2014, 9:51 AM

## 2014-05-22 NOTE — Progress Notes (Signed)
    Subjective: Procedure(s) (LRB): REVISION LUMBAR FUSION L4-S1        (2 LEVEL)  (N/A) ILIAC CREST BONE GRAFT HARVEST (N/A) 2 Days Post-Op  Patient reports pain as 3 on 0-10 scale.  Reports decreased leg pain reports incisional back pain   Positive void Negative bowel movement Positive flatus Negative chest pain or shortness of breath  Objective: Vital signs in last 24 hours: Temp:  [97.6 F (36.4 C)-98.1 F (36.7 C)] 97.6 F (36.4 C) (04/23 0502) Pulse Rate:  [51-60] 60 (04/23 0502) Resp:  [18] 18 (04/23 0502) BP: (139-149)/(59-64) 145/64 mmHg (04/23 0502) SpO2:  [94 %-99 %] 96 % (04/23 0755)  Intake/Output from previous day: 04/22 0701 - 04/23 0700 In: 1080 [P.O.:1080] Out: -   Labs: No results for input(s): WBC, RBC, HCT, PLT in the last 72 hours. No results for input(s): NA, K, CL, CO2, BUN, CREATININE, GLUCOSE, CALCIUM in the last 72 hours. No results for input(s): LABPT, INR in the last 72 hours.  Physical Exam: Neurologically intact ABD soft Intact pulses distally Incision: dressing C/D/I and no drainage Compartment soft  Assessment/Plan: Patient stable  xrays n/a Continue mobilization with physical therapy Continue care  Advance diet Up with therapy  Mag citrate/enema for constipation Plan on d/c later this afternoon  Melina Schools, MD [place] [phone]

## 2014-08-21 ENCOUNTER — Telehealth: Payer: Self-pay | Admitting: Physician Assistant

## 2014-08-21 DIAGNOSIS — I2510 Atherosclerotic heart disease of native coronary artery without angina pectoris: Secondary | ICD-10-CM

## 2014-08-21 DIAGNOSIS — I10 Essential (primary) hypertension: Secondary | ICD-10-CM

## 2014-08-21 DIAGNOSIS — E7800 Pure hypercholesterolemia, unspecified: Secondary | ICD-10-CM

## 2014-08-21 MED ORDER — AMLODIPINE BESYLATE 10 MG PO TABS: 10.0000 mg | ORAL_TABLET | Freq: Every day | ORAL | Status: AC

## 2014-08-21 MED ORDER — METOPROLOL TARTRATE 50 MG PO TABS: 50.0000 mg | ORAL_TABLET | Freq: Two times a day (BID) | ORAL | Status: DC

## 2014-08-21 MED ORDER — NITROGLYCERIN 0.4 MG SL SUBL: 0.4000 mg | SUBLINGUAL_TABLET | SUBLINGUAL | Status: DC | PRN

## 2014-08-21 MED ORDER — CLOPIDOGREL BISULFATE 75 MG PO TABS: 75.0000 mg | ORAL_TABLET | Freq: Every day | ORAL | Status: DC

## 2014-08-21 MED ORDER — PRAVASTATIN SODIUM 20 MG PO TABS: 20.0000 mg | ORAL_TABLET | Freq: Every evening | ORAL | Status: AC

## 2014-08-21 NOTE — Telephone Encounter (Signed)
Patient with history of CAD, HTN, HL. He lost his medications. He called in for refills of metoprolol tartrate, amlodipine, Plavix, pravastatin and nitroglycerin. These were sent to his pharmacy in [place]. Richardson Dopp, PA-C   08/21/2014 12:38 PM
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
## 2014-08-30 ENCOUNTER — Ambulatory Visit (INDEPENDENT_AMBULATORY_CARE_PROVIDER_SITE_OTHER): Payer: Medicaid Other | Admitting: Cardiovascular Disease

## 2014-08-30 ENCOUNTER — Encounter: Payer: Self-pay | Admitting: Cardiovascular Disease

## 2014-08-30 VITALS — BP 126/62 | HR 65 | Ht 69.0 in | Wt 158.5 lb

## 2014-08-30 DIAGNOSIS — I159 Secondary hypertension, unspecified: Secondary | ICD-10-CM | POA: Diagnosis not present

## 2014-08-30 DIAGNOSIS — R0989 Other specified symptoms and signs involving the circulatory and respiratory systems: Secondary | ICD-10-CM

## 2014-08-30 NOTE — Patient Instructions (Addendum)
Medication Instructions:  No changes  Labwork: NONE  Testing/Procedures: Your physician has requested that you have a carotid duplex. This test is an ultrasound of the carotid arteries in your neck. It looks at blood flow through these arteries that supply the brain with blood. Allow one hour for this exam. There are no restrictions or special instructions. DUE IN  DECEMBER  Follow-Up: Your physician recommends that you schedule a follow-up appointment in:  December  WITH  DR  Johnsie Cancel  Any Other Special Instructions Will Be Listed Below (If Applicable).

## 2014-09-16 ENCOUNTER — Ambulatory Visit (HOSPITAL_COMMUNITY)
Admission: RE | Admit: 2014-09-16 | Discharge: 2014-09-16 | Disposition: A | Discharge status: Home / Self Care | Payer: Medicaid Other | Source: Ambulatory Visit | Attending: Cardiovascular Disease | Admitting: Cardiovascular Disease

## 2014-09-16 DIAGNOSIS — R0989 Other specified symptoms and signs involving the circulatory and respiratory systems: Secondary | ICD-10-CM

## 2014-09-16 DIAGNOSIS — I6523 Occlusion and stenosis of bilateral carotid arteries: Secondary | ICD-10-CM | POA: Diagnosis not present

## 2014-11-26 ENCOUNTER — Telehealth: Payer: Self-pay | Admitting: Cardiovascular Disease

## 2014-11-26 NOTE — Telephone Encounter (Signed)
New Message  Per recall , schedule Carotid and f/up. I have scheduled the Carotid for 02/07/2014. Please put in orders.

## 2014-11-29 ENCOUNTER — Other Ambulatory Visit: Payer: Self-pay | Admitting: *Deleted

## 2014-11-29 DIAGNOSIS — R0989 Other specified symptoms and signs involving the circulatory and respiratory systems: Secondary | ICD-10-CM

## 2014-11-29 NOTE — Telephone Encounter (Signed)
ORDER ENTERED ./CY 

## 2015-02-08 ENCOUNTER — Ambulatory Visit: Payer: Medicaid Other | Admitting: Cardiovascular Disease

## 2015-02-08 ENCOUNTER — Inpatient Hospital Stay (HOSPITAL_COMMUNITY): Admission: RE | Admit: 2015-02-08 | Payer: Medicaid Other | Source: Ambulatory Visit

## 2015-03-30 ENCOUNTER — Ambulatory Visit: Payer: Medicaid Other | Admitting: Cardiovascular Disease

## 2015-03-30 ENCOUNTER — Encounter (HOSPITAL_COMMUNITY): Payer: Medicaid Other
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
## 2015-05-30 ENCOUNTER — Encounter: Payer: Self-pay | Admitting: Cardiovascular Disease

## 2015-05-31 ENCOUNTER — Encounter: Payer: Medicaid Other | Admitting: Cardiovascular Disease

## 2015-05-31 ENCOUNTER — Encounter (HOSPITAL_COMMUNITY): Payer: Medicaid Other
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
## 2015-09-13 ENCOUNTER — Ambulatory Visit: Payer: Medicaid Other | Admitting: Cardiovascular Disease

## 2015-09-22 ENCOUNTER — Telehealth: Payer: Self-pay | Admitting: Cardiovascular Disease

## 2015-09-22 NOTE — Telephone Encounter (Signed)
Patient received a call from our office. Patient does not know what the call was for. Patient is due for a carotid duplex, so scheduled patient to have done next Tuesday. Patient verbalized understanding.

## 2015-09-22 NOTE — Telephone Encounter (Signed)
New message ° ° ° ° ° ° °Pt returning nurse call  °

## 2015-09-27 ENCOUNTER — Ambulatory Visit (HOSPITAL_COMMUNITY)
Admission: RE | Admit: 2015-09-27 | Discharge: 2015-09-27 | Disposition: A | Discharge status: Home / Self Care | Payer: Medicaid Other | Source: Ambulatory Visit | Attending: Internal Medicine | Admitting: Internal Medicine

## 2015-09-27 ENCOUNTER — Encounter (INDEPENDENT_AMBULATORY_CARE_PROVIDER_SITE_OTHER): Payer: Self-pay

## 2015-09-27 DIAGNOSIS — Z720 Tobacco use: Secondary | ICD-10-CM | POA: Insufficient documentation

## 2015-09-27 DIAGNOSIS — I2510 Atherosclerotic heart disease of native coronary artery without angina pectoris: Secondary | ICD-10-CM | POA: Diagnosis not present

## 2015-09-27 DIAGNOSIS — R0989 Other specified symptoms and signs involving the circulatory and respiratory systems: Secondary | ICD-10-CM | POA: Insufficient documentation

## 2015-09-27 DIAGNOSIS — E785 Hyperlipidemia, unspecified: Secondary | ICD-10-CM | POA: Insufficient documentation

## 2015-09-27 DIAGNOSIS — I739 Peripheral vascular disease, unspecified: Secondary | ICD-10-CM | POA: Diagnosis not present

## 2015-09-27 DIAGNOSIS — I6523 Occlusion and stenosis of bilateral carotid arteries: Secondary | ICD-10-CM | POA: Insufficient documentation

## 2015-09-27 DIAGNOSIS — I10 Essential (primary) hypertension: Secondary | ICD-10-CM | POA: Insufficient documentation

## 2015-09-28 ENCOUNTER — Telehealth: Payer: Self-pay | Admitting: *Deleted

## 2015-09-28 DIAGNOSIS — I2510 Atherosclerotic heart disease of native coronary artery without angina pectoris: Secondary | ICD-10-CM

## 2015-09-28 DIAGNOSIS — I6522 Occlusion and stenosis of left carotid artery: Secondary | ICD-10-CM

## 2015-09-28 NOTE — Telephone Encounter (Signed)
Pt notified of carotid results and findings by phone with verbal understanding. Repeat carotids in 1 yr, order placed in Epic today.

## 2015-12-28 ENCOUNTER — Ambulatory Visit: Payer: Medicaid Other | Admitting: Cardiovascular Disease

## 2015-12-29 ENCOUNTER — Ambulatory Visit: Payer: Medicaid Other | Admitting: Cardiovascular Disease

## 2016-01-05 ENCOUNTER — Encounter: Payer: Self-pay | Admitting: Cardiovascular Disease
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
## 2016-03-23 ENCOUNTER — Ambulatory Visit (INDEPENDENT_AMBULATORY_CARE_PROVIDER_SITE_OTHER): Payer: Medicaid Other | Admitting: Cardiovascular Disease

## 2016-03-23 ENCOUNTER — Encounter: Payer: Self-pay | Admitting: Cardiovascular Disease

## 2016-03-23 ENCOUNTER — Encounter (INDEPENDENT_AMBULATORY_CARE_PROVIDER_SITE_OTHER): Payer: Self-pay

## 2016-03-23 VITALS — BP 140/80 | HR 71 | Ht 68.0 in | Wt 159.4 lb

## 2016-03-23 DIAGNOSIS — I6522 Occlusion and stenosis of left carotid artery: Secondary | ICD-10-CM | POA: Diagnosis not present

## 2016-03-23 MED ORDER — METHOCARBAMOL 500 MG PO TABS: 500.0000 mg | ORAL_TABLET | Freq: Three times a day (TID) | ORAL | 0 refills | Status: DC

## 2016-03-23 NOTE — Patient Instructions (Addendum)
Medication Instructions:  Your physician has recommended you make the following change in your medication:  1-Take Robaxin 500 mg by mouth 3 times daily for 10 days  Labwork: NONE  Testing/Procedures: NONE  Follow-Up: Your physician wants you to follow-up in: 6 months with Dr. Johnsie Cancel. You will receive a reminder letter in the mail two months in advance. If you don't receive a letter, please call our office to schedule the follow-up appointment.   If you need a refill on your cardiac medications before your next appointment, please call your pharmacy.  Marland Kitchen

## 2017-01-11 ENCOUNTER — Telehealth: Payer: Self-pay | Admitting: Cardiovascular Disease

## 2017-01-11 DIAGNOSIS — R0989 Other specified symptoms and signs involving the circulatory and respiratory systems: Secondary | ICD-10-CM

## 2017-01-11 NOTE — Telephone Encounter (Signed)
New message     Patient daughter [phone] Judeen Hammans ) calling to request new order for carotid study if needed.  Previous carotid order expired. Please advise

## 2017-01-11 NOTE — Telephone Encounter (Signed)
Per last carotid results on 09/29/15. Patient is to follow up with carotid duplex in one year.

## 2017-02-08 ENCOUNTER — Ambulatory Visit (HOSPITAL_COMMUNITY)
Admission: RE | Admit: 2017-02-08 | Payer: Medicare Other | Source: Ambulatory Visit | Attending: Cardiovascular Disease | Admitting: Cardiovascular Disease

## 2017-02-12 ENCOUNTER — Encounter: Payer: Self-pay | Admitting: Cardiovascular Disease

## 2017-02-25 ENCOUNTER — Ambulatory Visit (HOSPITAL_COMMUNITY)
Admission: RE | Admit: 2017-02-25 | Discharge: 2017-02-25 | Disposition: A | Discharge status: Home / Self Care | Payer: Medicare Other | Source: Ambulatory Visit | Attending: Cardiovascular Disease | Admitting: Cardiovascular Disease

## 2017-02-25 DIAGNOSIS — R0989 Other specified symptoms and signs involving the circulatory and respiratory systems: Secondary | ICD-10-CM | POA: Diagnosis present

## 2017-02-25 DIAGNOSIS — I10 Essential (primary) hypertension: Secondary | ICD-10-CM | POA: Diagnosis not present

## 2017-02-25 DIAGNOSIS — I6523 Occlusion and stenosis of bilateral carotid arteries: Secondary | ICD-10-CM | POA: Diagnosis not present

## 2017-02-25 DIAGNOSIS — Z87891 Personal history of nicotine dependence: Secondary | ICD-10-CM | POA: Insufficient documentation

## 2017-02-25 DIAGNOSIS — I2510 Atherosclerotic heart disease of native coronary artery without angina pectoris: Secondary | ICD-10-CM | POA: Diagnosis not present

## 2017-02-25 DIAGNOSIS — E785 Hyperlipidemia, unspecified: Secondary | ICD-10-CM | POA: Diagnosis not present

## 2017-02-25 NOTE — Progress Notes (Signed)
Patient ID: JERRED ZAREMBA, male   DOB: 1950/09/18, 67 y.o.   MRN: [id number] Patient ID: JESSEN SIEGMAN, male   DOB: May 12, 1950, 68 y.o.   MRN: [id number]   Quron is seen today for F/U of previous  smoking, PVD with previous right ilac stent. CAD with prevous CABG and known occluded SVG to RCA. He is not having any SSCP or claudication. He has a significant anxiety disorder and has had issues with Xanax. He tried to substitue ctalopram for xanax and Kushal doesnt' like it. He has some anger issues and actually admits to buying Xanax on the street. We discussed a referral to behavioral health and I think this would be helpful Back better after lumbar Fusion with Dr Rolena Infante in 2016  Does not want statin Had bad myalgias with simvastatin    Started smoking again Counseled for less than 10 minutes on cessation    Vascular disease has been stable  02/25/17 : carotid left ICA 40-59%  03/2012 ABI 1 on right and .9 on left    CABG: Gerhardt  2008  Last cath 2009  Sequential LIMA to D/LAD patent SVG OM patent SVG RCA occluded  Complains of exertional back pain Hard to tell if it might be claudication No chest pain No TIA;s   ROS: Denies fever, malais, weight loss, blurry vision, decreased visual acuity, cough, sputum, SOB, hemoptysis, pleuritic pain, palpitaitons, heartburn, abdominal pain, melena, lower extremity edema, claudication, or rash.  All other systems reviewed and negative  General: BP 130/70   Pulse (!) 59   Ht 5\' 9"  (1.753 m)   Wt 171 lb 3.2 oz (77.7 kg)   SpO2 98%   BMI 25.28 kg/m  Affect appropriate Healthy:  appears stated age HEENT: normal Neck supple with no adenopathy JVP normal bilateral bruits no thyromegaly Lungs clear with no wheezing and good diaphragmatic motion Heart:  S1/S2 no murmur, no rub, gallop or click PMI normal Abdomen: benighn, BS positve, no tenderness, no AAA no bruit.  No HSM or HJR Distal pulses diminished  No edema Neuro non-focal Skin warm  and dry Previous lumbar fusion     Current Outpatient Medications  Medication Sig Dispense Refill  . ALPRAZolam (XANAX) 1 MG tablet Take 1 mg by mouth 3 (three) times daily.    Marland Kitchen amLODipine (NORVASC) 10 MG tablet Take 1 tablet (10 mg total) by mouth daily. 30 tablet 0  . budesonide-formoterol (SYMBICORT) 160-4.5 MCG/ACT inhaler Inhale 2 puffs into the lungs 2 (two) times daily.    . clopidogrel (PLAVIX) 75 MG tablet Take 75 mg by mouth daily.    . Coenzyme Q10 (CO Q 10) 100 MG CAPS Take 100 mg by mouth daily.    . folic acid (FOLVITE) 710 MCG tablet Take 400 mcg by mouth daily.    . iron polysaccharides (NIFEREX) 150 MG capsule Take 150 mg by mouth daily.    . metoprolol tartrate (LOPRESSOR) 100 MG tablet Take 100 mg by mouth 2 (two) times daily.    . multivitamin (ONE-A-DAY MEN'S) TABS tablet Take 1 tablet by mouth daily.    . nitroGLYCERIN (NITROSTAT) 0.4 MG SL tablet Place 1 tablet (0.4 mg total) under the tongue every 5 (five) minutes as needed for chest pain. 25 tablet 0  . omeprazole (PRILOSEC) 20 MG capsule TAKE 1 CAPSULE BY MOUTH ONCE DAILY 30 capsule 11  . oxyCODONE-acetaminophen (PERCOCET) 10-325 MG per tablet Take 1 tablet by mouth every 4 (four) hours as needed for pain. 60 tablet  0  . pravastatin (PRAVACHOL) 20 MG tablet Take 1 tablet (20 mg total) by mouth every evening. 30 tablet 0  . triamterene-hydrochlorothiazide (MAXZIDE) 75-50 MG tablet Take 1 tablet by mouth daily.     No current facility-administered medications for this visit.     Allergies  Patient has no known allergies.  Electrocardiogram: 4/15  SR rate 65 ICLBBB LVH   08/30/14  SR rate 65  ICLBBB LVH   SR rate 64 inferior T wave changes    Assessment and Plan  CAD: Previous CABG April 2008  Known occlusion SVG RCA  Stable with no angina and good activity level.  Continue medical Rx  Carotid: no change in right bruit  Duplex reviewed from 02/25/17 stable 40-59% LIICA stenosis.  F/U duplex in 1  year.  Chol:  Lab Results  Component Value Date   LDLCALC 109 (H) 08/13/2013    Ortho: Improvement in back post surgery continue with brace and TENS Rx  Psych:  Stable still having ETOH and anger issues   HTN:  Well controlled.  Continue current medications and low sodium Dash type diet.    PVD: previous right iliac stent normal ABI's 2014  Complains of "back" pain will order duplex and ABI's Given known proximal vascular disease that may mimic back pain    Jenkins Rouge

## 2017-02-27 ENCOUNTER — Encounter: Payer: Self-pay | Admitting: Cardiovascular Disease

## 2017-02-27 ENCOUNTER — Ambulatory Visit (INDEPENDENT_AMBULATORY_CARE_PROVIDER_SITE_OTHER): Payer: Medicare Other | Admitting: Cardiovascular Disease

## 2017-02-27 VITALS — BP 130/70 | HR 59 | Ht 69.0 in | Wt 171.2 lb

## 2017-02-27 DIAGNOSIS — I6522 Occlusion and stenosis of left carotid artery: Secondary | ICD-10-CM | POA: Diagnosis not present

## 2017-02-27 DIAGNOSIS — Z95828 Presence of other vascular implants and grafts: Secondary | ICD-10-CM | POA: Diagnosis not present

## 2017-02-27 DIAGNOSIS — R0989 Other specified symptoms and signs involving the circulatory and respiratory systems: Secondary | ICD-10-CM | POA: Diagnosis not present

## 2017-02-27 DIAGNOSIS — I2510 Atherosclerotic heart disease of native coronary artery without angina pectoris: Secondary | ICD-10-CM

## 2017-02-27 MED ORDER — NITROGLYCERIN 0.4 MG SL SUBL: 0.4000 mg | SUBLINGUAL_TABLET | SUBLINGUAL | 0 refills | Status: DC | PRN

## 2017-02-27 NOTE — Patient Instructions (Addendum)
Medication Instructions:  Your physician recommends that you continue on your current medications as directed. Please refer to the Current Medication list given to you today.  Labwork: NONE  Testing/Procedures: Your physician has requested that you have a lower extremity arterial exercise duplex with ABI. During this test, exercise and ultrasound are used to evaluate arterial blood flow in the legs. Allow one hour for this exam. There are no restrictions or special instructions.  Your physician has requested that you have a carotid duplex in one year. This test is an ultrasound of the carotid arteries in your neck. It looks at blood flow through these arteries that supply the brain with blood. Allow one hour for this exam. There are no restrictions or special instructions.  Follow-Up: Your physician wants you to follow-up in: 12 months with Dr. Johnsie Cancel. You will receive a reminder letter in the mail two months in advance. If you don't receive a letter, please call our office to schedule the follow-up appointment.   If you need a refill on your cardiac medications before your next appointment, please call your pharmacy.

## 2017-03-04 ENCOUNTER — Other Ambulatory Visit: Payer: Self-pay | Admitting: Cardiovascular Disease

## 2017-03-04 DIAGNOSIS — Z95828 Presence of other vascular implants and grafts: Secondary | ICD-10-CM

## 2017-03-04 DIAGNOSIS — I739 Peripheral vascular disease, unspecified: Secondary | ICD-10-CM

## 2017-03-12 ENCOUNTER — Inpatient Hospital Stay (HOSPITAL_COMMUNITY): Admission: RE | Admit: 2017-03-12 | Payer: Medicare Other | Source: Ambulatory Visit

## 2017-03-20 ENCOUNTER — Ambulatory Visit (HOSPITAL_COMMUNITY): Payer: Medicare Other

## 2017-03-20 ENCOUNTER — Ambulatory Visit (HOSPITAL_COMMUNITY)
Admission: RE | Admit: 2017-03-20 | Payer: Medicare Other | Source: Ambulatory Visit | Attending: Cardiovascular Disease | Admitting: Cardiovascular Disease

## 2017-03-27 ENCOUNTER — Ambulatory Visit (INDEPENDENT_AMBULATORY_CARE_PROVIDER_SITE_OTHER): Payer: Medicare Other | Admitting: Internal Medicine

## 2017-04-03 ENCOUNTER — Ambulatory Visit (HOSPITAL_BASED_OUTPATIENT_CLINIC_OR_DEPARTMENT_OTHER)
Admission: RE | Admit: 2017-04-03 | Discharge: 2017-04-03 | Disposition: A | Discharge status: Home / Self Care | Payer: Medicare Other | Source: Ambulatory Visit | Attending: Cardiovascular Disease | Admitting: Cardiovascular Disease

## 2017-04-03 ENCOUNTER — Ambulatory Visit (HOSPITAL_COMMUNITY)
Admission: RE | Admit: 2017-04-03 | Discharge: 2017-04-03 | Disposition: A | Discharge status: Home / Self Care | Payer: Medicare Other | Source: Ambulatory Visit | Attending: Cardiovascular Disease | Admitting: Cardiovascular Disease

## 2017-04-03 DIAGNOSIS — I739 Peripheral vascular disease, unspecified: Secondary | ICD-10-CM | POA: Diagnosis present

## 2017-04-03 DIAGNOSIS — Z95828 Presence of other vascular implants and grafts: Secondary | ICD-10-CM | POA: Insufficient documentation

## 2017-04-05 ENCOUNTER — Other Ambulatory Visit: Payer: Self-pay | Admitting: Cardiovascular Disease

## 2017-04-18 ENCOUNTER — Ambulatory Visit (INDEPENDENT_AMBULATORY_CARE_PROVIDER_SITE_OTHER): Payer: Medicare Other | Admitting: Internal Medicine

## 2017-10-29 ENCOUNTER — Telehealth: Payer: Self-pay | Admitting: Cardiovascular Disease

## 2017-10-29 NOTE — Telephone Encounter (Signed)
New message:      Pt would like to know if he needs to have a LVT of his legs due to him not having it this year

## 2017-10-29 NOTE — Telephone Encounter (Signed)
Left message for patient to call back  

## 2017-11-07 NOTE — Telephone Encounter (Signed)
Left second message for patient to call back.

## 2017-11-12 NOTE — Telephone Encounter (Signed)
Left third message for patient to call back. Will await patient to call back. 

## 2017-12-09 ENCOUNTER — Other Ambulatory Visit: Payer: Self-pay | Admitting: Cardiovascular Disease

## 2017-12-09 DIAGNOSIS — I2510 Atherosclerotic heart disease of native coronary artery without angina pectoris: Secondary | ICD-10-CM

## 2018-02-27 ENCOUNTER — Ambulatory Visit (HOSPITAL_COMMUNITY): Admission: RE | Admit: 2018-02-27 | Payer: Medicare Other | Source: Ambulatory Visit

## 2018-03-10 ENCOUNTER — Ambulatory Visit (HOSPITAL_COMMUNITY)
Admission: RE | Admit: 2018-03-10 | Discharge: 2018-03-10 | Disposition: A | Discharge status: Home / Self Care | Payer: Medicare HMO | Source: Ambulatory Visit | Attending: Internal Medicine | Admitting: Internal Medicine

## 2018-03-10 DIAGNOSIS — R0989 Other specified symptoms and signs involving the circulatory and respiratory systems: Secondary | ICD-10-CM | POA: Diagnosis not present

## 2018-03-10 DIAGNOSIS — I6522 Occlusion and stenosis of left carotid artery: Secondary | ICD-10-CM | POA: Insufficient documentation

## 2018-03-12 DIAGNOSIS — R69 Illness, unspecified: Secondary | ICD-10-CM | POA: Diagnosis not present

## 2018-03-12 DIAGNOSIS — I2510 Atherosclerotic heart disease of native coronary artery without angina pectoris: Secondary | ICD-10-CM | POA: Diagnosis not present

## 2018-03-12 DIAGNOSIS — M519 Unspecified thoracic, thoracolumbar and lumbosacral intervertebral disc disorder: Secondary | ICD-10-CM | POA: Diagnosis not present

## 2018-03-18 ENCOUNTER — Telehealth: Payer: Self-pay

## 2018-03-18 ENCOUNTER — Encounter: Payer: Self-pay | Admitting: Cardiovascular Disease

## 2018-03-18 DIAGNOSIS — E7800 Pure hypercholesterolemia, unspecified: Secondary | ICD-10-CM

## 2018-03-18 DIAGNOSIS — R0989 Other specified symptoms and signs involving the circulatory and respiratory systems: Secondary | ICD-10-CM

## 2018-03-18 DIAGNOSIS — I739 Peripheral vascular disease, unspecified: Secondary | ICD-10-CM

## 2018-03-18 DIAGNOSIS — I2510 Atherosclerotic heart disease of native coronary artery without angina pectoris: Secondary | ICD-10-CM

## 2018-03-18 NOTE — Telephone Encounter (Signed)
-----   Message from Josue Hector, MD sent at 03/10/2018  1:43 PM EST ----- Plaque no stenosis f/u carotid duplex in 2 years

## 2018-03-18 NOTE — Telephone Encounter (Signed)
Error

## 2018-03-18 NOTE — Telephone Encounter (Signed)
Patient aware of results. Per Dr. Johnsie Cancel, plaque no stenosis f/u carotid duplex in 2 years. Patient verbalized understanding.  Order placed for carotid and made patient an appointment for one year f/u.

## 2018-04-09 DIAGNOSIS — R69 Illness, unspecified: Secondary | ICD-10-CM | POA: Diagnosis not present

## 2018-04-09 DIAGNOSIS — Z79891 Long term (current) use of opiate analgesic: Secondary | ICD-10-CM | POA: Diagnosis not present

## 2018-04-09 DIAGNOSIS — I2510 Atherosclerotic heart disease of native coronary artery without angina pectoris: Secondary | ICD-10-CM | POA: Diagnosis not present

## 2018-04-09 DIAGNOSIS — M519 Unspecified thoracic, thoracolumbar and lumbosacral intervertebral disc disorder: Secondary | ICD-10-CM | POA: Diagnosis not present

## 2018-04-09 DIAGNOSIS — J449 Chronic obstructive pulmonary disease, unspecified: Secondary | ICD-10-CM | POA: Diagnosis not present

## 2018-04-28 ENCOUNTER — Telehealth: Payer: Self-pay

## 2018-04-28 NOTE — Telephone Encounter (Signed)
Called patient. Canceled appt on 05/06/18. Recall in for 6 month f/u.      Primary Cardiologist:  Johnsie Cancel  Patient contacted.  History reviewed by Dr. Johnsie Cancel.  No symptoms to suggest any unstable cardiac conditions.  Based on discussion, with current pandemic situation, we will be postponing this appointment for [name] with a plan for f/u in 6 months or sooner if feasible/necessary.  If symptoms change, he has been instructed to contact our office.   Michaelyn Barter, RN  04/28/2018 3:24 PM         .

## 2018-04-28 NOTE — Telephone Encounter (Signed)
-----   Message from Josue Hector, MD sent at 04/28/2018  1:53 PM EDT ----- Can cancel 4/7 appointment reschedule in 6 months

## 2018-05-06 ENCOUNTER — Ambulatory Visit: Payer: Medicare HMO | Admitting: Cardiovascular Disease

## 2018-06-10 DIAGNOSIS — K5190 Ulcerative colitis, unspecified, without complications: Secondary | ICD-10-CM | POA: Diagnosis not present

## 2018-06-10 DIAGNOSIS — I10 Essential (primary) hypertension: Secondary | ICD-10-CM | POA: Diagnosis not present

## 2018-06-10 DIAGNOSIS — F1010 Alcohol abuse, uncomplicated: Secondary | ICD-10-CM | POA: Diagnosis not present

## 2018-06-10 DIAGNOSIS — M519 Unspecified thoracic, thoracolumbar and lumbosacral intervertebral disc disorder: Secondary | ICD-10-CM | POA: Diagnosis not present

## 2018-06-10 DIAGNOSIS — R69 Illness, unspecified: Secondary | ICD-10-CM | POA: Diagnosis not present

## 2018-06-10 DIAGNOSIS — R7301 Impaired fasting glucose: Secondary | ICD-10-CM | POA: Diagnosis not present

## 2018-06-10 DIAGNOSIS — K921 Melena: Secondary | ICD-10-CM | POA: Diagnosis not present

## 2018-06-10 DIAGNOSIS — E7800 Pure hypercholesterolemia, unspecified: Secondary | ICD-10-CM | POA: Diagnosis not present

## 2018-06-10 DIAGNOSIS — N189 Chronic kidney disease, unspecified: Secondary | ICD-10-CM | POA: Diagnosis not present

## 2018-06-11 ENCOUNTER — Encounter (HOSPITAL_COMMUNITY): Payer: Self-pay | Admitting: Emergency Medicine

## 2018-06-11 ENCOUNTER — Other Ambulatory Visit: Payer: Self-pay

## 2018-06-11 ENCOUNTER — Emergency Department (HOSPITAL_COMMUNITY): Payer: Medicare HMO

## 2018-06-11 ENCOUNTER — Observation Stay (HOSPITAL_COMMUNITY)
Admission: EM | Admit: 2018-06-11 | Discharge: 2018-06-11 | Discharge status: Against Medical Advice | Payer: Medicare HMO | Attending: Family Medicine | Admitting: Family Medicine

## 2018-06-11 DIAGNOSIS — I739 Peripheral vascular disease, unspecified: Secondary | ICD-10-CM | POA: Diagnosis not present

## 2018-06-11 DIAGNOSIS — K219 Gastro-esophageal reflux disease without esophagitis: Secondary | ICD-10-CM | POA: Diagnosis not present

## 2018-06-11 DIAGNOSIS — M1990 Unspecified osteoarthritis, unspecified site: Secondary | ICD-10-CM | POA: Diagnosis not present

## 2018-06-11 DIAGNOSIS — J449 Chronic obstructive pulmonary disease, unspecified: Secondary | ICD-10-CM | POA: Insufficient documentation

## 2018-06-11 DIAGNOSIS — Z951 Presence of aortocoronary bypass graft: Secondary | ICD-10-CM | POA: Insufficient documentation

## 2018-06-11 DIAGNOSIS — I2510 Atherosclerotic heart disease of native coronary artery without angina pectoris: Secondary | ICD-10-CM | POA: Diagnosis not present

## 2018-06-11 DIAGNOSIS — K921 Melena: Secondary | ICD-10-CM | POA: Diagnosis not present

## 2018-06-11 DIAGNOSIS — N281 Cyst of kidney, acquired: Secondary | ICD-10-CM | POA: Insufficient documentation

## 2018-06-11 DIAGNOSIS — Z8249 Family history of ischemic heart disease and other diseases of the circulatory system: Secondary | ICD-10-CM | POA: Diagnosis not present

## 2018-06-11 DIAGNOSIS — R69 Illness, unspecified: Secondary | ICD-10-CM | POA: Diagnosis not present

## 2018-06-11 DIAGNOSIS — Z87891 Personal history of nicotine dependence: Secondary | ICD-10-CM | POA: Diagnosis not present

## 2018-06-11 DIAGNOSIS — G4700 Insomnia, unspecified: Secondary | ICD-10-CM | POA: Insufficient documentation

## 2018-06-11 DIAGNOSIS — I10 Essential (primary) hypertension: Secondary | ICD-10-CM | POA: Insufficient documentation

## 2018-06-11 DIAGNOSIS — I252 Old myocardial infarction: Secondary | ICD-10-CM | POA: Diagnosis not present

## 2018-06-11 DIAGNOSIS — K5730 Diverticulosis of large intestine without perforation or abscess without bleeding: Secondary | ICD-10-CM | POA: Insufficient documentation

## 2018-06-11 DIAGNOSIS — Z79899 Other long term (current) drug therapy: Secondary | ICD-10-CM | POA: Insufficient documentation

## 2018-06-11 DIAGNOSIS — N400 Enlarged prostate without lower urinary tract symptoms: Secondary | ICD-10-CM | POA: Insufficient documentation

## 2018-06-11 DIAGNOSIS — D509 Iron deficiency anemia, unspecified: Principal | ICD-10-CM | POA: Insufficient documentation

## 2018-06-11 DIAGNOSIS — E785 Hyperlipidemia, unspecified: Secondary | ICD-10-CM | POA: Diagnosis not present

## 2018-06-11 DIAGNOSIS — R42 Dizziness and giddiness: Secondary | ICD-10-CM | POA: Diagnosis not present

## 2018-06-11 DIAGNOSIS — Z981 Arthrodesis status: Secondary | ICD-10-CM | POA: Insufficient documentation

## 2018-06-11 DIAGNOSIS — I700 Atherosclerosis of aorta: Secondary | ICD-10-CM | POA: Diagnosis not present

## 2018-06-11 DIAGNOSIS — E7800 Pure hypercholesterolemia, unspecified: Secondary | ICD-10-CM | POA: Insufficient documentation

## 2018-06-11 DIAGNOSIS — Z8711 Personal history of peptic ulcer disease: Secondary | ICD-10-CM | POA: Diagnosis not present

## 2018-06-11 DIAGNOSIS — Z66 Do not resuscitate: Secondary | ICD-10-CM | POA: Insufficient documentation

## 2018-06-11 DIAGNOSIS — Z1159 Encounter for screening for other viral diseases: Secondary | ICD-10-CM | POA: Insufficient documentation

## 2018-06-11 DIAGNOSIS — K922 Gastrointestinal hemorrhage, unspecified: Secondary | ICD-10-CM | POA: Diagnosis not present

## 2018-06-11 DIAGNOSIS — F419 Anxiety disorder, unspecified: Secondary | ICD-10-CM | POA: Insufficient documentation

## 2018-06-11 DIAGNOSIS — Z7902 Long term (current) use of antithrombotics/antiplatelets: Secondary | ICD-10-CM | POA: Diagnosis not present

## 2018-06-11 DIAGNOSIS — G2581 Restless legs syndrome: Secondary | ICD-10-CM | POA: Insufficient documentation

## 2018-06-11 DIAGNOSIS — Z7951 Long term (current) use of inhaled steroids: Secondary | ICD-10-CM | POA: Diagnosis not present

## 2018-06-11 DIAGNOSIS — R109 Unspecified abdominal pain: Secondary | ICD-10-CM | POA: Diagnosis not present

## 2018-06-11 DIAGNOSIS — D649 Anemia, unspecified: Secondary | ICD-10-CM | POA: Diagnosis not present

## 2018-06-11 DIAGNOSIS — M549 Dorsalgia, unspecified: Secondary | ICD-10-CM | POA: Insufficient documentation

## 2018-06-11 DIAGNOSIS — G8929 Other chronic pain: Secondary | ICD-10-CM | POA: Insufficient documentation

## 2018-06-11 LAB — TYPE AND SCREEN
ABO/RH(D): O POS
Antibody Screen: NEGATIVE

## 2018-06-11 LAB — CBC
HCT: 25.6 % — ABNORMAL LOW (ref 39.0–52.0)
Hemoglobin: 8.3 g/dL — ABNORMAL LOW (ref 13.0–17.0)
MCH: 29.6 pg (ref 26.0–34.0)
MCHC: 32.4 g/dL (ref 30.0–36.0)
MCV: 91.4 fL (ref 80.0–100.0)
Platelets: 385 10*3/uL (ref 150–400)
RBC: 2.8 MIL/uL — ABNORMAL LOW (ref 4.22–5.81)
RDW: 13.1 % (ref 11.5–15.5)
WBC: 7 10*3/uL (ref 4.0–10.5)
nRBC: 0 % (ref 0.0–0.2)

## 2018-06-11 LAB — COMPREHENSIVE METABOLIC PANEL
ALT: 16 U/L (ref 0–44)
AST: 22 U/L (ref 15–41)
Albumin: 3.7 g/dL (ref 3.5–5.0)
Alkaline Phosphatase: 72 U/L (ref 38–126)
Anion gap: 11 (ref 5–15)
BUN: 14 mg/dL (ref 8–23)
CO2: 22 mmol/L (ref 22–32)
Calcium: 9.6 mg/dL (ref 8.9–10.3)
Chloride: 101 mmol/L (ref 98–111)
Creatinine, Ser: 1.38 mg/dL — ABNORMAL HIGH (ref 0.61–1.24)
GFR calc Af Amer: >60 mL/min (ref >60)
GFR calc non Af Amer: 53 mL/min — ABNORMAL LOW (ref >60)
Glucose, Bld: 101 mg/dL — ABNORMAL HIGH (ref 70–99)
Potassium: 3.9 mmol/L (ref 3.5–5.1)
Sodium: 134 mmol/L — ABNORMAL LOW (ref 135–145)
Total Bilirubin: 0.5 mg/dL (ref 0.3–1.2)
Total Protein: 6.9 g/dL (ref 6.5–8.1)

## 2018-06-11 LAB — SARS CORONAVIRUS 2 BY RT PCR (HOSPITAL ORDER, PERFORMED IN ~~LOC~~ HOSPITAL LAB): SARS Coronavirus 2: NEGATIVE

## 2018-06-11 LAB — POC OCCULT BLOOD, ED: Fecal Occult Bld: POSITIVE — AB

## 2018-06-11 MED ORDER — SODIUM CHLORIDE 0.9 % IV SOLN: INTRAVENOUS | Status: DC

## 2018-06-11 MED ORDER — METOPROLOL TARTRATE 100 MG PO TABS: 100.0000 mg | ORAL_TABLET | Freq: Two times a day (BID) | ORAL | Status: DC

## 2018-06-11 MED ORDER — AMLODIPINE BESYLATE 10 MG PO TABS: 10.0000 mg | ORAL_TABLET | Freq: Every day | ORAL | Status: DC

## 2018-06-11 MED ORDER — ACETAMINOPHEN 325 MG PO TABS: 650.0000 mg | ORAL_TABLET | Freq: Four times a day (QID) | ORAL | Status: DC

## 2018-06-11 MED ORDER — PANTOPRAZOLE SODIUM 40 MG IV SOLR: 40.0000 mg | Freq: Two times a day (BID) | INTRAVENOUS | Status: DC

## 2018-06-11 MED ORDER — VITAMIN B-1 100 MG PO TABS: 100.0000 mg | ORAL_TABLET | Freq: Every day | ORAL | Status: DC

## 2018-06-11 MED ORDER — PANTOPRAZOLE SODIUM 40 MG IV SOLR: 40.0000 mg | Freq: Once | INTRAVENOUS | Status: DC

## 2018-06-11 MED ORDER — ACETAMINOPHEN 650 MG RE SUPP: 650.0000 mg | Freq: Four times a day (QID) | RECTAL | Status: DC

## 2018-06-11 MED ORDER — ALPRAZOLAM 0.5 MG PO TABS: 1.0000 mg | ORAL_TABLET | Freq: Three times a day (TID) | ORAL | Status: DC | PRN

## 2018-06-11 MED ORDER — ADULT MULTIVITAMIN W/MINERALS CH: 1.0000 | ORAL_TABLET | Freq: Every day | ORAL | Status: DC

## 2018-06-11 MED ORDER — THIAMINE HCL 100 MG/ML IJ SOLN: 100.0000 mg | Freq: Every day | INTRAMUSCULAR | Status: DC

## 2018-06-11 MED ORDER — OXYCODONE HCL 5 MG PO TABS: 10.0000 mg | ORAL_TABLET | Freq: Four times a day (QID) | ORAL | Status: DC | PRN

## 2018-06-11 MED ORDER — FOLIC ACID 1 MG PO TABS: 1.0000 mg | ORAL_TABLET | Freq: Every day | ORAL | Status: DC

## 2018-06-11 MED ORDER — PRAVASTATIN SODIUM 10 MG PO TABS: 20.0000 mg | ORAL_TABLET | Freq: Every evening | ORAL | Status: DC

## 2018-06-11 MED ORDER — IOHEXOL 300 MG/ML  SOLN
100.0000 mL | Freq: Once | INTRAMUSCULAR | Status: AC | PRN
  Administered 2018-06-11: 100 mL via INTRAVENOUS

## 2018-06-11 MED ORDER — MOMETASONE FURO-FORMOTEROL FUM 200-5 MCG/ACT IN AERO
2.0000 | INHALATION_SPRAY | Freq: Two times a day (BID) | RESPIRATORY_TRACT | Status: DC
  Filled 2018-06-11: qty 8.8

## 2018-06-11 NOTE — Progress Notes (Signed)
Called to patient room. Patient being rude to staff. He stated he only takes his own medications and he will not take hospital medications. He stated he does not want to pay expensive medication from the hospital. "Mina Marble will let me take my own medication." Explained to patient we can store his medications but patient refused.   Patient refuses to sign AMA form. MD aware of AMA.

## 2018-06-11 NOTE — ED Notes (Signed)
ED Provider at bedside. 
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
## 2018-06-11 NOTE — ED Triage Notes (Signed)
Pt in with c/o melena x a few days, states his doctor sent him to ED for low Hg. Takes Plavix, denies any cp, n/v or sov
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
## 2018-06-11 NOTE — ED Notes (Signed)
Attempted report 

## 2018-06-11 NOTE — Progress Notes (Signed)
Patient walked out the hallway and left. IV and telemetry were taken out.
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
## 2018-06-11 NOTE — Progress Notes (Signed)
Patient decided to leave AMA after he would not allow nursing staff to administer hospital medications rather than his own.  He said that he was allowed to take the medications he brought from home during his hospitalization at [facility].  He was informed that unfortunately, this is against hospital policy, so he signed out [facility].   Amanda C. Shan Levans, MD PGY-2, [facility] 06/11/2018 7:58 PM

## 2018-06-12 DIAGNOSIS — E785 Hyperlipidemia, unspecified: Secondary | ICD-10-CM | POA: Diagnosis not present

## 2018-06-12 DIAGNOSIS — D500 Iron deficiency anemia secondary to blood loss (chronic): Secondary | ICD-10-CM | POA: Diagnosis not present

## 2018-06-12 DIAGNOSIS — Z720 Tobacco use: Secondary | ICD-10-CM | POA: Diagnosis not present

## 2018-06-12 DIAGNOSIS — K922 Gastrointestinal hemorrhage, unspecified: Secondary | ICD-10-CM | POA: Diagnosis not present

## 2018-06-12 DIAGNOSIS — I2510 Atherosclerotic heart disease of native coronary artery without angina pectoris: Secondary | ICD-10-CM | POA: Diagnosis not present

## 2018-06-12 DIAGNOSIS — K219 Gastro-esophageal reflux disease without esophagitis: Secondary | ICD-10-CM | POA: Diagnosis not present

## 2018-06-12 DIAGNOSIS — Z862 Personal history of diseases of the blood and blood-forming organs and certain disorders involving the immune mechanism: Secondary | ICD-10-CM | POA: Diagnosis not present

## 2018-06-12 DIAGNOSIS — I509 Heart failure, unspecified: Secondary | ICD-10-CM | POA: Diagnosis not present

## 2018-06-12 DIAGNOSIS — K5730 Diverticulosis of large intestine without perforation or abscess without bleeding: Secondary | ICD-10-CM | POA: Diagnosis not present

## 2018-06-12 DIAGNOSIS — R7989 Other specified abnormal findings of blood chemistry: Secondary | ICD-10-CM | POA: Diagnosis not present

## 2018-06-12 DIAGNOSIS — I110 Hypertensive heart disease with heart failure: Secondary | ICD-10-CM | POA: Diagnosis not present

## 2018-06-12 DIAGNOSIS — K625 Hemorrhage of anus and rectum: Secondary | ICD-10-CM | POA: Diagnosis not present

## 2018-06-12 DIAGNOSIS — Z8719 Personal history of other diseases of the digestive system: Secondary | ICD-10-CM | POA: Diagnosis not present

## 2018-06-12 DIAGNOSIS — Z7289 Other problems related to lifestyle: Secondary | ICD-10-CM | POA: Diagnosis not present

## 2018-06-12 DIAGNOSIS — K5900 Constipation, unspecified: Secondary | ICD-10-CM | POA: Diagnosis not present

## 2018-06-16 ENCOUNTER — Ambulatory Visit: Payer: Medicare HMO | Admitting: Internal Medicine

## 2018-06-19 DIAGNOSIS — K5190 Ulcerative colitis, unspecified, without complications: Secondary | ICD-10-CM | POA: Diagnosis not present

## 2018-06-19 DIAGNOSIS — K921 Melena: Secondary | ICD-10-CM | POA: Diagnosis not present

## 2018-06-26 DIAGNOSIS — K5900 Constipation, unspecified: Secondary | ICD-10-CM | POA: Diagnosis not present

## 2018-06-26 DIAGNOSIS — Z7289 Other problems related to lifestyle: Secondary | ICD-10-CM | POA: Diagnosis not present

## 2018-06-26 DIAGNOSIS — K922 Gastrointestinal hemorrhage, unspecified: Secondary | ICD-10-CM | POA: Diagnosis not present

## 2018-06-26 DIAGNOSIS — D500 Iron deficiency anemia secondary to blood loss (chronic): Secondary | ICD-10-CM | POA: Diagnosis not present

## 2018-07-07 DIAGNOSIS — D500 Iron deficiency anemia secondary to blood loss (chronic): Secondary | ICD-10-CM | POA: Diagnosis not present

## 2018-07-14 DIAGNOSIS — M519 Unspecified thoracic, thoracolumbar and lumbosacral intervertebral disc disorder: Secondary | ICD-10-CM | POA: Diagnosis not present

## 2018-07-16 DIAGNOSIS — D500 Iron deficiency anemia secondary to blood loss (chronic): Secondary | ICD-10-CM | POA: Diagnosis not present

## 2018-07-28 DIAGNOSIS — D123 Benign neoplasm of transverse colon: Secondary | ICD-10-CM | POA: Diagnosis not present

## 2018-07-28 DIAGNOSIS — D509 Iron deficiency anemia, unspecified: Secondary | ICD-10-CM | POA: Diagnosis not present

## 2018-07-28 DIAGNOSIS — D124 Benign neoplasm of descending colon: Secondary | ICD-10-CM | POA: Diagnosis not present

## 2018-07-30 DIAGNOSIS — I2510 Atherosclerotic heart disease of native coronary artery without angina pectoris: Secondary | ICD-10-CM | POA: Diagnosis not present

## 2018-07-30 DIAGNOSIS — I509 Heart failure, unspecified: Secondary | ICD-10-CM | POA: Diagnosis not present

## 2018-07-30 DIAGNOSIS — I110 Hypertensive heart disease with heart failure: Secondary | ICD-10-CM | POA: Diagnosis not present

## 2018-07-30 DIAGNOSIS — G629 Polyneuropathy, unspecified: Secondary | ICD-10-CM | POA: Diagnosis not present

## 2018-07-30 DIAGNOSIS — R69 Illness, unspecified: Secondary | ICD-10-CM | POA: Diagnosis not present

## 2018-07-30 DIAGNOSIS — J449 Chronic obstructive pulmonary disease, unspecified: Secondary | ICD-10-CM | POA: Diagnosis not present

## 2018-07-30 DIAGNOSIS — E785 Hyperlipidemia, unspecified: Secondary | ICD-10-CM | POA: Diagnosis not present

## 2018-07-30 DIAGNOSIS — G8929 Other chronic pain: Secondary | ICD-10-CM | POA: Diagnosis not present

## 2018-08-12 DIAGNOSIS — J449 Chronic obstructive pulmonary disease, unspecified: Secondary | ICD-10-CM | POA: Diagnosis not present

## 2018-08-12 DIAGNOSIS — I2510 Atherosclerotic heart disease of native coronary artery without angina pectoris: Secondary | ICD-10-CM | POA: Diagnosis not present

## 2018-08-12 DIAGNOSIS — Z79891 Long term (current) use of opiate analgesic: Secondary | ICD-10-CM | POA: Diagnosis not present

## 2018-08-12 DIAGNOSIS — M519 Unspecified thoracic, thoracolumbar and lumbosacral intervertebral disc disorder: Secondary | ICD-10-CM | POA: Diagnosis not present

## 2018-08-15 DIAGNOSIS — D500 Iron deficiency anemia secondary to blood loss (chronic): Secondary | ICD-10-CM | POA: Diagnosis not present

## 2018-08-21 DIAGNOSIS — D509 Iron deficiency anemia, unspecified: Secondary | ICD-10-CM | POA: Diagnosis not present

## 2018-09-10 DIAGNOSIS — R69 Illness, unspecified: Secondary | ICD-10-CM | POA: Diagnosis not present

## 2018-09-10 DIAGNOSIS — N189 Chronic kidney disease, unspecified: Secondary | ICD-10-CM | POA: Diagnosis not present

## 2018-09-10 DIAGNOSIS — I2510 Atherosclerotic heart disease of native coronary artery without angina pectoris: Secondary | ICD-10-CM | POA: Diagnosis not present

## 2018-09-23 DIAGNOSIS — N281 Cyst of kidney, acquired: Secondary | ICD-10-CM | POA: Diagnosis not present

## 2018-09-23 DIAGNOSIS — K633 Ulcer of intestine: Secondary | ICD-10-CM | POA: Diagnosis not present

## 2018-09-30 DIAGNOSIS — N189 Chronic kidney disease, unspecified: Secondary | ICD-10-CM | POA: Diagnosis not present

## 2018-09-30 DIAGNOSIS — E7800 Pure hypercholesterolemia, unspecified: Secondary | ICD-10-CM | POA: Diagnosis not present

## 2018-09-30 DIAGNOSIS — I10 Essential (primary) hypertension: Secondary | ICD-10-CM | POA: Diagnosis not present

## 2018-09-30 DIAGNOSIS — R7301 Impaired fasting glucose: Secondary | ICD-10-CM | POA: Diagnosis not present

## 2018-10-08 DIAGNOSIS — K921 Melena: Secondary | ICD-10-CM | POA: Diagnosis not present

## 2018-10-08 DIAGNOSIS — I2510 Atherosclerotic heart disease of native coronary artery without angina pectoris: Secondary | ICD-10-CM | POA: Diagnosis not present

## 2018-10-08 DIAGNOSIS — N189 Chronic kidney disease, unspecified: Secondary | ICD-10-CM | POA: Diagnosis not present

## 2018-10-08 DIAGNOSIS — M519 Unspecified thoracic, thoracolumbar and lumbosacral intervertebral disc disorder: Secondary | ICD-10-CM | POA: Diagnosis not present

## 2018-10-23 DIAGNOSIS — K289 Gastrojejunal ulcer, unspecified as acute or chronic, without hemorrhage or perforation: Secondary | ICD-10-CM | POA: Diagnosis not present

## 2018-10-23 DIAGNOSIS — K5900 Constipation, unspecified: Secondary | ICD-10-CM | POA: Diagnosis not present

## 2018-10-23 DIAGNOSIS — D509 Iron deficiency anemia, unspecified: Secondary | ICD-10-CM | POA: Diagnosis not present

## 2018-10-23 DIAGNOSIS — Z8719 Personal history of other diseases of the digestive system: Secondary | ICD-10-CM | POA: Diagnosis not present

## 2018-11-06 DIAGNOSIS — M519 Unspecified thoracic, thoracolumbar and lumbosacral intervertebral disc disorder: Secondary | ICD-10-CM | POA: Diagnosis not present

## 2018-11-06 DIAGNOSIS — R69 Illness, unspecified: Secondary | ICD-10-CM | POA: Diagnosis not present

## 2018-11-06 DIAGNOSIS — K921 Melena: Secondary | ICD-10-CM | POA: Diagnosis not present

## 2018-11-23 DIAGNOSIS — R404 Transient alteration of awareness: Secondary | ICD-10-CM | POA: Diagnosis not present

## 2018-11-30 DIAGNOSIS — 419620001 Death: Secondary | SNOMED CT | POA: Diagnosis not present

## 2018-11-30 DEATH — deceased
